# Patient Record
Sex: Female | Born: 1980 | Race: Asian | Hispanic: No | Marital: Married | Smoking: Never smoker
Health system: Southern US, Community
[De-identification: ages and names within clinical notes are randomized; demographics above are authoritative.]

## PROBLEM LIST (undated history)

## (undated) DIAGNOSIS — E119 Type 2 diabetes mellitus without complications: Secondary | ICD-10-CM

## (undated) DIAGNOSIS — O24419 Gestational diabetes mellitus in pregnancy, unspecified control: Secondary | ICD-10-CM

## (undated) HISTORY — DX: Type 2 diabetes mellitus without complications: E11.9

## (undated) HISTORY — PX: NO PAST SURGERIES: SHX2092

---

## 2017-04-02 LAB — OB RESULTS CONSOLE RUBELLA ANTIBODY, IGM: Rubella: IMMUNE

## 2017-04-02 LAB — OB RESULTS CONSOLE RPR: RPR: NONREACTIVE

## 2017-04-02 LAB — OB RESULTS CONSOLE GC/CHLAMYDIA
CHLAMYDIA, DNA PROBE: NEGATIVE
GC PROBE AMP, GENITAL: NEGATIVE

## 2017-04-02 LAB — OB RESULTS CONSOLE HGB/HCT, BLOOD
HCT: 36
HEMOGLOBIN: 11.6

## 2017-04-02 LAB — OB RESULTS CONSOLE ABO/RH: RH Type: POSITIVE

## 2017-04-02 LAB — OB RESULTS CONSOLE HEPATITIS B SURFACE ANTIGEN: HEP B S AG: NEGATIVE

## 2017-04-02 LAB — OB RESULTS CONSOLE VARICELLA ZOSTER ANTIBODY, IGG: Varicella: IMMUNE

## 2017-04-02 LAB — OB RESULTS CONSOLE ANTIBODY SCREEN: ANTIBODY SCREEN: NEGATIVE

## 2017-04-02 LAB — OB RESULTS CONSOLE HIV ANTIBODY (ROUTINE TESTING): HIV: NONREACTIVE

## 2017-05-03 ENCOUNTER — Encounter: Payer: Self-pay | Admitting: Family Medicine

## 2017-05-03 DIAGNOSIS — Z789 Other specified health status: Secondary | ICD-10-CM | POA: Insufficient documentation

## 2017-05-03 DIAGNOSIS — O09299 Supervision of pregnancy with other poor reproductive or obstetric history, unspecified trimester: Secondary | ICD-10-CM | POA: Insufficient documentation

## 2017-05-03 DIAGNOSIS — O0990 Supervision of high risk pregnancy, unspecified, unspecified trimester: Secondary | ICD-10-CM | POA: Insufficient documentation

## 2017-05-11 ENCOUNTER — Encounter: Payer: Self-pay | Admitting: Family Medicine

## 2017-05-11 ENCOUNTER — Ambulatory Visit (INDEPENDENT_AMBULATORY_CARE_PROVIDER_SITE_OTHER): Payer: Medicaid Other | Admitting: Family Medicine

## 2017-05-11 VITALS — BP 100/70 | HR 77 | Ht 59.0 in | Wt 112.0 lb

## 2017-05-11 DIAGNOSIS — O09899 Supervision of other high risk pregnancies, unspecified trimester: Secondary | ICD-10-CM

## 2017-05-11 DIAGNOSIS — O09522 Supervision of elderly multigravida, second trimester: Secondary | ICD-10-CM

## 2017-05-11 DIAGNOSIS — Z789 Other specified health status: Secondary | ICD-10-CM

## 2017-05-11 DIAGNOSIS — O09892 Supervision of other high risk pregnancies, second trimester: Secondary | ICD-10-CM

## 2017-05-11 DIAGNOSIS — O24419 Gestational diabetes mellitus in pregnancy, unspecified control: Secondary | ICD-10-CM

## 2017-05-11 DIAGNOSIS — O09529 Supervision of elderly multigravida, unspecified trimester: Secondary | ICD-10-CM | POA: Insufficient documentation

## 2017-05-11 MED ORDER — ACCU-CHEK FASTCLIX LANCETS MISC
12 refills | Status: DC
Start: 2017-05-11 — End: 2017-06-01

## 2017-05-11 MED ORDER — GLUCOSE BLOOD VI STRP: ORAL_STRIP | 12 refills | Status: DC

## 2017-05-11 MED ORDER — ACCU-CHEK GUIDE W/DEVICE KIT: 1.0000 | PACK | Freq: Four times a day (QID) | 0 refills | Status: DC

## 2017-05-11 NOTE — Progress Notes (Signed)
  Subjective:  Janice Hines is a [id number] 2459w0d being seen today for her first obstetrical visit. [facility] 8 weeks and had an early 1hr GTT that was elevated. A 3hr GTT was also elevated and the patient was referred here. Her obstetrical history is significant for Diabetes in pregnancy, failed 3-hour GTT at 14 weeks, AMA (will be 37 at time of delivery). Patient does intend to breast feed. Pregnancy history fully reviewed.  Patient reports no complaints.   BP 100/70   Pulse 77   Ht 4\' 11"  (1.499 m)   Wt 112 lb (50.8 kg)   LMP 02/02/2017   BMI 22.62 kg/m   HISTORY: OB History  Gravida Para Term Preterm AB Living  4 3 3     2   SAB TAB Ectopic Multiple Live Births          3    # Outcome Date GA Lbr Len/2nd Weight Sex Delivery Anes PTL Lv  4 Current           3 Term 06/20/13   9 lb 8 oz (4.309 kg)  Vag-Spont   LIV  2 Term 06/11/09   8 lb 11 oz (3.941 kg) M Vag-Spont     1 Term 03/31/04 10884w0d  8 lb 3 oz (3.714 kg) M    LIV      History reviewed. No pertinent past medical history.  History reviewed. No pertinent surgical history.  History reviewed. No pertinent family history.   Exam    Uterus:     Pelvic Exam: Previously done  System:     Skin: normal coloration and turgor, no rashes    Neurologic: gait normal; reflexes normal and symmetric   Extremities: normal strength, tone, and muscle mass, no deformities, no erythema, induration, or nodules   HEENT PERRLA and extra ocular movement intact   Mouth/Teeth mucous membranes moist, pharynx normal without lesions   Neck supple and no masses   Cardiovascular: regular rate and rhythm, no murmurs or gallops   Respiratory:  appears well, vitals normal, no respiratory distress, acyanotic, normal RR, ear and throat exam is normal, neck free of mass or lymphadenopathy, chest clear, no wheezing, crepitations, rhonchi, normal symmetric air entry   Abdomen: soft, non-tender; bowel sounds normal; no masses,  no organomegaly       Assessment:    Pregnancy: [id number] There are no active problems to display for this patient.     Plan:   1. Gestational diabetes mellitus (GDM), antepartum, gestational diabetes method of control unspecified Discussed implications of DM on baby. Will check HgA1c to see if preexisting or gestational. May need TSH if HgA1c >6.0. May need CMP and UP:C.  - Hemoglobin A1c - Referral to Nutrition and Diabetes Services - US MFM OB DETAIL +14 WK; Future  2. Supervision of other high risk pregnancy, antepartum Fht and FH normal - US MFM OB DETAIL +14 WK; Future  3. AMA (advanced maternal age) multigravida 35+, unspecified trimester Discussed genetic testing for Down syndrome - declined. - US MFM OB DETAIL +14 WK; Future  4. Language barrier Burmese - interpreter used.     Problem list reviewed and updated. 75% of 30 min visit spent on counseling and coordination of care.     Levie HeritageJacob J Lalita Ebel 05/11/2017

## 2017-05-11 NOTE — Progress Notes (Signed)
Pt is having some occasional nausea, doing better now. Pt is having some lower pelvic pressure.

## 2017-05-12 LAB — HEMOGLOBIN A1C
Est. average glucose Bld gHb Est-mCnc: 88 mg/dL
Hgb A1c MFr Bld: 4.7 % — ABNORMAL LOW (ref 4.8–5.6)

## 2017-05-22 ENCOUNTER — Encounter: Payer: Medicaid Other | Attending: Family Medicine | Admitting: Skilled Nursing Facility1

## 2017-05-22 ENCOUNTER — Encounter: Payer: Self-pay | Admitting: Skilled Nursing Facility1

## 2017-05-22 DIAGNOSIS — Z713 Dietary counseling and surveillance: Secondary | ICD-10-CM | POA: Diagnosis not present

## 2017-05-22 DIAGNOSIS — O24419 Gestational diabetes mellitus in pregnancy, unspecified control: Secondary | ICD-10-CM

## 2017-05-22 NOTE — Progress Notes (Signed)
Lay-she from language resources interpretor Diabetes Self-Management Education  Visit Type: First/Initial  05/22/2017  Ms. Janice Hines, identified by name and date of birth, is a 36 y.o. female with a diagnosis of Diabetes: Gestational Diabetes.   ASSESSMENT  Height 4\' 11"  (1.499 m), weight 110 lb 6.4 oz (50.1 kg), last menstrual period 02/02/2017. Body mass index is 22.3 kg/m.  Accompanied by niece.  3 months along. 4th pregnancy  Dizzy and throw up and less energy sometimes in the morning and sometimes in the evening  Weight has increased No prior diagnosis of diabetes/prediabetes   Pts blood sugar reading: 81  Diabetes Self-Management Education - 05/22/17 0944      Visit Information   Visit Type  First/Initial      Initial Visit   Diabetes Type  Gestational Diabetes      Psychosocial Assessment   Self-management support  Family    Other persons present  Interpreter;Family Member    Patient Concerns  Nutrition/Meal planning    Learning Readiness  Ready      Pre-Education Assessment   Patient understands the diabetes disease and treatment process.  Needs Instruction    Patient understands incorporating nutritional management into lifestyle.  Needs Instruction    Patient undertands incorporating physical activity into lifestyle.  Needs Instruction    Patient understands using medications safely.  Needs Instruction    Patient understands monitoring blood glucose, interpreting and using results  Needs Instruction    Patient understands prevention, detection, and treatment of acute complications.  Needs Instruction    Patient understands prevention, detection, and treatment of chronic complications.  Needs Instruction    Patient understands how to develop strategies to address psychosocial issues.  Needs Instruction    Patient understands how to develop strategies to promote health/change behavior.  Needs Instruction      Complications   How often do you check your blood  sugar?  0 times/day (not testing)    Have you had a dilated eye exam in the past 12 months?  No    Have you had a dental exam in the past 12 months?  No    Are you checking your feet?  No      Dietary Intake   Breakfast   1/2 cup rice and vegetables: broccoli or cabbage or asparagus  and fruit and meat: fish or pork or beef or chicken     Snack (morning)  fruit    Lunch  meat vegetable fruit: orange apple 1/2 rice with milk     Snack (afternoon)  fruit    Dinner  meat vegetable fruit: orange apple 1/2 rice milk     Snack (evening)  fruit    Beverage(s)   3 2% 8 oz of milk, water      Exercise   Exercise Type  Light (walking / raking leaves)    How many days per week to you exercise?  3    How many minutes per day do you exercise?  10    Total minutes per week of exercise  30      Patient Education   Previous Diabetes Education  No    Disease state   Factors that contribute to the development of diabetes    Nutrition management   Role of diet in the treatment of diabetes and the relationship between the three main macronutrients and blood glucose level;Reviewed blood glucose goals for pre and post meals and how to evaluate the patients' food intake on  their blood glucose level.    Physical activity and exercise   Role of exercise on diabetes management, blood pressure control and cardiac health.    Monitoring  Purpose and frequency of SMBG.;Taught/evaluated SMBG meter.;Identified appropriate SMBG and/or A1C goals.    Preconception care  Reviewed with patient blood glucose goals with pregnancy      Individualized Goals (developed by patient)   Nutrition  Follow meal plan discussed    Physical Activity  Exercise 3-5 times per week;15 minutes per day    Monitoring   test my blood glucose as discussed;test blood glucose pre and post meals as discussed      Post-Education Assessment   Patient understands the diabetes disease and treatment process.  Demonstrates understanding / competency     Patient understands incorporating nutritional management into lifestyle.  Demonstrates understanding / competency    Patient undertands incorporating physical activity into lifestyle.  Demonstrates understanding / competency    Patient understands using medications safely.  Demonstrates understanding / competency    Patient understands monitoring blood glucose, interpreting and using results  Demonstrates understanding / competency    Patient understands prevention, detection, and treatment of acute complications.  Demonstrates understanding / competency    Patient understands prevention, detection, and treatment of chronic complications.  Demonstrates understanding / competency    Patient understands how to develop strategies to address psychosocial issues.  Demonstrates understanding / competency    Patient understands how to develop strategies to promote health/change behavior.  Demonstrates understanding / competency      Outcomes   Expected Outcomes  Demonstrated interest in learning. Expect positive outcomes    Future DMSE  PRN    Program Status  Completed       Individualized Plan for Diabetes Self-Management Training:   Learning Objective:  Patient will have a greater understanding of diabetes self-management. Patient education plan is to attend individual and/or group sessions per assessed needs and concerns.   Plan:   There are no Patient Instructions on file for this visit.  Expected Outcomes:  Demonstrated interest in learning. Expect positive outcomes  Education material provided: Niece took notes in Burmese  If problems or questions, patient to contact team via:  Phone  Future DSME appointment: PRN.

## 2017-06-01 ENCOUNTER — Encounter: Payer: Self-pay | Admitting: Family Medicine

## 2017-06-01 ENCOUNTER — Ambulatory Visit (INDEPENDENT_AMBULATORY_CARE_PROVIDER_SITE_OTHER): Payer: Medicaid Other | Admitting: Family Medicine

## 2017-06-01 VITALS — BP 103/61 | HR 78 | Wt 111.0 lb

## 2017-06-01 DIAGNOSIS — O24419 Gestational diabetes mellitus in pregnancy, unspecified control: Secondary | ICD-10-CM

## 2017-06-01 DIAGNOSIS — O09522 Supervision of elderly multigravida, second trimester: Secondary | ICD-10-CM

## 2017-06-01 DIAGNOSIS — Z789 Other specified health status: Secondary | ICD-10-CM

## 2017-06-01 DIAGNOSIS — O09899 Supervision of other high risk pregnancies, unspecified trimester: Secondary | ICD-10-CM

## 2017-06-01 DIAGNOSIS — O09529 Supervision of elderly multigravida, unspecified trimester: Secondary | ICD-10-CM

## 2017-06-01 MED ORDER — ACCU-CHEK FASTCLIX LANCETS MISC: 12 refills | Status: DC

## 2017-06-01 MED ORDER — PRENATAL PLUS 27-1 MG PO TABS: 1.0000 | ORAL_TABLET | Freq: Every day | ORAL | 11 refills | Status: AC

## 2017-06-01 MED ORDER — GLUCOSE BLOOD VI STRP: ORAL_STRIP | 12 refills | Status: DC

## 2017-06-01 NOTE — Progress Notes (Signed)
Subjective:  Janice Hines is a 36 y.o. [id number] at 3164w0d being seen today for ongoing prenatal care.  She is currently monitored for the following issues for this high-risk pregnancy and has Gestational diabetes mellitus (GDM), antepartum; Supervision of other high risk pregnancy, antepartum; AMA (advanced maternal age) multigravida 35+, unspecified trimester; and Language barrier on their problem list.  GDM: Patient diet controlled. HgA1c checked last visit: 4.7 Fasting: controlled 2hr PP: had 3 elevated CBGs after dinner, but improved over last 5 days after limiting rice in diet  Patient reports no complaints.  Contractions: Not present. Vag. Bleeding: None.  Movement: Present. Denies leaking of fluid.   The following portions of the patient's history were reviewed and updated as appropriate: allergies, current medications, past family history, past medical history, past social history, past surgical history and problem list. Problem list updated.  Objective:   Vitals:   06/01/17 0921  BP: 103/61  Pulse: 78  Weight: 111 lb (50.3 kg)    Fetal Status: Fetal Heart Rate (bpm): 157   Movement: Present     General:  Alert, oriented and cooperative. Patient is in no acute distress.  Skin: Skin is warm and dry. No rash noted.   Cardiovascular: Normal heart rate noted  Respiratory: Normal respiratory effort, no problems with respiration noted  Abdomen: Soft, gravid, appropriate for gestational age. Pain/Pressure: Absent     Pelvic: Vag. Bleeding: None Vag D/C Character: Thin   Cervical exam deferred        Extremities: Normal range of motion.  Edema: None  Mental Status: Normal mood and affect. Normal behavior. Normal judgment and thought content.   Urinalysis:      Assessment and Plan:  Pregnancy: [id number] at 4564w0d  1. Supervision of other high risk pregnancy, antepartum FHT and FH normal. US on 12/17  2. Gestational diabetes mellitus (GDM), antepartum, gestational diabetes method of  control unspecified Continue diet control  3. AMA (advanced maternal age) multigravida 35+, unspecified trimester Declined genetic testing  4. Language barrier Interpreter present, sister interpreted most of visit   Preterm labor symptoms and general obstetric precautions including but not limited to vaginal bleeding, contractions, leaking of fluid and fetal movement were reviewed in detail with the patient. Please refer to After Visit Summary for other counseling recommendations.  Return in about 3 weeks (around 06/22/2017) for OB f/u.   Levie HeritageStinson, Jacob J, DO

## 2017-06-12 ENCOUNTER — Ambulatory Visit (HOSPITAL_COMMUNITY)
Admission: RE | Admit: 2017-06-12 | Discharge: 2017-06-12 | Disposition: A | Discharge status: Home / Self Care | Payer: Medicaid Other | Source: Ambulatory Visit | Attending: Family Medicine | Admitting: Family Medicine

## 2017-06-12 ENCOUNTER — Encounter (HOSPITAL_COMMUNITY): Payer: Self-pay

## 2017-06-12 ENCOUNTER — Other Ambulatory Visit: Payer: Self-pay | Admitting: Family Medicine

## 2017-06-12 ENCOUNTER — Other Ambulatory Visit: Payer: Self-pay

## 2017-06-12 DIAGNOSIS — Z3689 Encounter for other specified antenatal screening: Secondary | ICD-10-CM | POA: Diagnosis not present

## 2017-06-12 DIAGNOSIS — O09522 Supervision of elderly multigravida, second trimester: Secondary | ICD-10-CM | POA: Diagnosis not present

## 2017-06-12 DIAGNOSIS — O321XX0 Maternal care for breech presentation, not applicable or unspecified: Secondary | ICD-10-CM | POA: Diagnosis not present

## 2017-06-12 DIAGNOSIS — Z3A18 18 weeks gestation of pregnancy: Secondary | ICD-10-CM | POA: Diagnosis not present

## 2017-06-12 DIAGNOSIS — O09529 Supervision of elderly multigravida, unspecified trimester: Secondary | ICD-10-CM

## 2017-06-12 DIAGNOSIS — O24410 Gestational diabetes mellitus in pregnancy, diet controlled: Secondary | ICD-10-CM | POA: Diagnosis not present

## 2017-06-12 DIAGNOSIS — O09899 Supervision of other high risk pregnancies, unspecified trimester: Secondary | ICD-10-CM

## 2017-06-12 DIAGNOSIS — O24419 Gestational diabetes mellitus in pregnancy, unspecified control: Secondary | ICD-10-CM

## 2017-06-12 NOTE — Progress Notes (Signed)
Genetic Counseling  High-Risk Gestation Note  Appointment Date:  06/12/2017 Referred By: Levie HeritageStinson, Jacob J, DO Date of Birth:  Jan 11, 1981   Pregnancy History: [id number] Estimated Date of Delivery: 11/09/17 Estimated Gestational Age: 9137w4d Attending: Darlyn ReadEmily Bunce, MD   Janice Hines was seen for genetic counseling because of a maternal age of 36 years old at delivery. Language Resources Burmese/English interpreter, Misty StanleyLisa, provided interpretation for today's visit. The patient was also accompanied by two nieces.    In summary:  Discussed AMA and associated risk for fetal aneuploidy  Reviewed results of previous screening  First screen within normal limits  Discussed options for screening  NIPS- declined  Ultrasound- performed today; see separate report  Discussed diagnostic testing options  Amniocentesis- declined  Reviewed family history concerns  They were counseled regarding maternal age and the association with risk for chromosome conditions due to nondisjunction with aging of the ova.   We reviewed chromosomes, nondisjunction, and the associated 1 in 141 risk for fetal aneuploidy at 3737w4d gestation related to a maternal age of 36 years old at delivery.  She was counseled that the risk for aneuploidy decreases as gestational age increases, accounting for those pregnancies which spontaneously abort.  We specifically discussed Down syndrome (trisomy 21), trisomies 4513 and 3818.   Janice Hines previously had First trimester screening performed through her prior OB provider, which was within normal limits for the conditions screened. We reviewed these results and the associated reduction in risks for Down syndrome (1 in 276 to 1 in 5,000) and Trisomy 18 (1 in 539 to < 1 in 10,000). We reviewed that screening tests are used to modify a patient's a priori risk for aneuploidy, typically based on age to provide a pregnancy specific risk assessment. We reviewed the detection rates. She understands  this is not diagnostic and does not screen for all chromosome conditions.   We reviewed additional available screening options including noninvasive prenatal screening (NIPS)/cell free DNA (cfDNA) screening and detailed ultrasound.  She was counseled that screening tests are used to modify a patient's a priori risk for aneuploidy, typically based on age. This estimate provides a pregnancy specific risk assessment. We reviewed the benefits and limitations of each option. Specifically, we discussed the conditions for which each test screens, the detection rates, and false positive rates of each. She was also counseled regarding diagnostic testing via amniocentesis. We reviewed the approximate 1 in 300-500 risk for complications from amniocentesis, including spontaneous pregnancy loss. We discussed the possible results that the tests might provide including: positive, negative, unanticipated, and no result. Finally, they were counseled regarding the cost of each option and potential out of pocket expenses.   A complete ultrasound was performed today. The ultrasound report will be sent under separate cover. There were no visualized fetal anomalies or markers suggestive of aneuploidy. After consideration of all the options, she declined NIPS and amniocentesis, indicating that she was comfortable with risk assessment from ultrasound and First screen. She understands that screening tests cannot rule out all birth defects or genetic syndromes. The patient was advised of this limitation and states she still does not want additional testing at this time.   Both family histories were reviewed and found to be noncontributory for birth defects, intellectual disability, and known genetic conditions. Burmese ancestry was reported for the couple, and consanguinity was denied. Without further information regarding the provided family history, an accurate genetic risk cannot be calculated. Further genetic counseling is  warranted if more information is  obtained.  Janice Hines denied exposure to environmental toxins or chemical agents. She denied the use of alcohol, tobacco or street drugs. She denied significant viral illnesses during the course of her pregnancy.   I counseled Ms. Hurley Ciscoha Hern regarding the above risks and available options.  The approximate face-to-face time with the genetic counselor was 20 minutes.  Janice PlowmanKaren Thimothy Barretta, MS,  Certified Genetic Counselor 06/12/2017

## 2017-06-12 NOTE — Progress Notes (Signed)
Interpreter present with patient from [employer].

## 2017-06-13 ENCOUNTER — Telehealth: Payer: Self-pay

## 2017-06-13 NOTE — Telephone Encounter (Signed)
Used interpreter [phone] for phone call out to patient to discuss results.  Left message for patient to return call back to the office. Armandina StammerJennifer Keelon Zurn RN

## 2017-06-13 NOTE — Telephone Encounter (Signed)
-----   Message from Levie HeritageJacob J Stinson, DO sent at 06/12/2017  1:46 PM EST ----- Please call patient and inform patient of placenta previa and discuss Pelvic Rest with her. Thanks!

## 2017-06-22 ENCOUNTER — Ambulatory Visit (INDEPENDENT_AMBULATORY_CARE_PROVIDER_SITE_OTHER): Payer: Medicaid Other | Admitting: Obstetrics & Gynecology

## 2017-06-22 VITALS — BP 96/61 | HR 86 | Wt 117.0 lb

## 2017-06-22 DIAGNOSIS — O24419 Gestational diabetes mellitus in pregnancy, unspecified control: Secondary | ICD-10-CM

## 2017-06-22 DIAGNOSIS — Z789 Other specified health status: Secondary | ICD-10-CM

## 2017-06-22 DIAGNOSIS — Z23 Encounter for immunization: Secondary | ICD-10-CM

## 2017-06-22 DIAGNOSIS — O4402 Placenta previa specified as without hemorrhage, second trimester: Secondary | ICD-10-CM

## 2017-06-22 DIAGNOSIS — O09899 Supervision of other high risk pregnancies, unspecified trimester: Secondary | ICD-10-CM

## 2017-06-22 DIAGNOSIS — O09892 Supervision of other high risk pregnancies, second trimester: Secondary | ICD-10-CM

## 2017-06-22 DIAGNOSIS — O09529 Supervision of elderly multigravida, unspecified trimester: Secondary | ICD-10-CM

## 2017-06-22 NOTE — Telephone Encounter (Signed)
Patient discussed this with doctor at visit today. Armandina StammerJennifer Howard RNBS

## 2017-06-22 NOTE — Progress Notes (Signed)
   PRENATAL VISIT NOTE  Subjective:  Janice Hines is a 36 y.o. G4P3003 at 6639w0d being seen today for ongoing prenatal care.  She is currently monitored for the following issues for this high-risk pregnancy and has Gestational diabetes mellitus (GDM), antepartum; Supervision of other high risk pregnancy, antepartum; AMA (advanced maternal age) multigravida 35+, unspecified trimester; Language barrier; and [redacted] weeks gestation of pregnancy on their problem list.  Patient reports no complaints.  Contractions: Irritability. Vag. Bleeding: None.  Movement: Present. Denies leaking of fluid.   The following portions of the patient's history were reviewed and updated as appropriate: allergies, current medications, past family history, past medical history, past social history, past surgical history and problem list. Problem list updated.  Objective:   Vitals:   06/22/17 0923  BP: 96/61  Pulse: 86  Weight: 117 lb (53.1 kg)    Fetal Status:     Movement: Present     General:  Alert, oriented and cooperative. Patient is in no acute distress.  Skin: Skin is warm and dry. No rash noted.   Cardiovascular: Normal heart rate noted  Respiratory: Normal respiratory effort, no problems with respiration noted  Abdomen: Soft, gravid, appropriate for gestational age.  Pain/Pressure: Absent     Pelvic: Cervical exam deferred        Extremities: Normal range of motion.     Mental Status:  Normal mood and affect. Normal behavior. Normal judgment and thought content.   Assessment and Plan:  Pregnancy: G4P3003 at 5739w0d  1. Supervision of other high risk pregnancy, antepartum  - Flu Vaccine QUAD 36+ mos IM (Fluarix, Quad PF)  2. Gestational diabetes mellitus (GDM), antepartum, gestational diabetes method of control unspecified Her sugars are ALL normal. I told her that she can quit checking sugars 2 hours after supper.  3. Language barrier Live interpretor present for exam  4. AMA (advanced maternal age)  multigravida 35+, unspecified trimester Normal first screen  5. Placenta previa in second trimester - AFP today - US MFM OB FOLLOW UP; Future - to follow up on previa at 28 weeks  Preterm labor symptoms and general obstetric precautions including but not limited to vaginal bleeding, contractions, leaking of fluid and fetal movement were reviewed in detail with the patient. Please refer to After Visit Summary for other counseling recommendations.  No Follow-up on file.   Allie BossierMyra C Adelheid Hoggard, MD

## 2017-06-26 LAB — AFP, SERUM, OPEN SPINA BIFIDA
AFP MoM: 1.05
AFP Value: 100.4 ng/mL
GEST. AGE ON COLLECTION DATE: 23.9 wk
Maternal Age At EDD: 35 yr
OSBR RISK 1 IN: 10000
TEST RESULTS AFP: NEGATIVE
Weight: 117 [lb_av]

## 2017-06-26 NOTE — L&D Delivery Note (Addendum)
Delivery Note At 3:38 AM a viable female was delivered via Vaginal, Spontaneous (Presentation: vertex; LOA ).  APGAR: , ; weight  .   Placenta status: intact , central cord insertion.  Cord: 3 vessel  with the following complications: none, extra long cord. Cord pH: N/A  Anesthesia:  Epidural Episiotomy: None Lacerations: None Suture Repair: N/A Est. Blood Loss (mL): 200  Mom to postpartum.  Baby to Couplet care / Skin to Skin.  Felisa Bonier, MD, PGY-1 Family Medicine - [facility] 11/06/2017, 3:58 AM  I confirm that I have verified the information documented in the resident's note and that I have also personally reperformed the physical exam and all medical decision making activities.  I was gloved and present for entire delivery SVD without incident No difficulty with shoulders No lacerations Cord noted to be very long, approximately 24-30 inches  Aviva Signs, CNM  Please schedule this patient for Postpartum visit in: 4 weeks with the following provider: Any provider For C/S patients schedule nurse incision check in weeks 2 weeks: no High risk pregnancy complicated by: GDM Delivery mode:  SVD Anticipated Birth Control:  BTL done PP PP Procedures needed: none  Schedule Integrated BH visit: no  Pt is Burmese

## 2017-07-23 ENCOUNTER — Encounter: Payer: Self-pay | Admitting: Obstetrics & Gynecology

## 2017-07-23 ENCOUNTER — Ambulatory Visit (INDEPENDENT_AMBULATORY_CARE_PROVIDER_SITE_OTHER): Payer: Medicaid Other | Admitting: Obstetrics & Gynecology

## 2017-07-23 VITALS — BP 104/70 | HR 97 | Wt 120.0 lb

## 2017-07-23 DIAGNOSIS — O09899 Supervision of other high risk pregnancies, unspecified trimester: Secondary | ICD-10-CM

## 2017-07-23 DIAGNOSIS — O09529 Supervision of elderly multigravida, unspecified trimester: Secondary | ICD-10-CM

## 2017-07-23 DIAGNOSIS — Z603 Acculturation difficulty: Secondary | ICD-10-CM

## 2017-07-23 DIAGNOSIS — O4402 Placenta previa specified as without hemorrhage, second trimester: Secondary | ICD-10-CM

## 2017-07-23 DIAGNOSIS — Z789 Other specified health status: Secondary | ICD-10-CM

## 2017-07-23 DIAGNOSIS — O09892 Supervision of other high risk pregnancies, second trimester: Secondary | ICD-10-CM

## 2017-07-23 DIAGNOSIS — O4400 Placenta previa specified as without hemorrhage, unspecified trimester: Secondary | ICD-10-CM | POA: Insufficient documentation

## 2017-07-23 DIAGNOSIS — O24410 Gestational diabetes mellitus in pregnancy, diet controlled: Secondary | ICD-10-CM

## 2017-07-23 DIAGNOSIS — O09522 Supervision of elderly multigravida, second trimester: Secondary | ICD-10-CM

## 2017-07-23 NOTE — Progress Notes (Signed)
   PRENATAL VISIT NOTE  Subjective:  Janice Hines is a 37 y.o. G4P3003 at 4459w3d being seen today for ongoing prenatal care.  She is currently monitored for the following issues for this high-risk pregnancy and has Gestational diabetes mellitus (GDM), antepartum; Supervision of other high risk pregnancy, antepartum; AMA (advanced maternal age) multigravida 35+, unspecified trimester; and Language barrier on their problem list.  Patient reports occ lower pelvic pain.  Contractions: Not present. Vag. Bleeding: None.  Movement: Present. Denies leaking of fluid.   The following portions of the patient's history were reviewed and updated as appropriate: allergies, current medications, past family history, past medical history, past social history, past surgical history and problem list. Problem list updated.  Objective:   Vitals:   07/23/17 1000  BP: 104/70  Pulse: 97  Weight: 120 lb (54.4 kg)    Fetal Status:     Movement: Present     General:  Alert, oriented and cooperative. Patient is in no acute distress.  Skin: Skin is warm and dry. No rash noted.   Cardiovascular: Normal heart rate noted  Respiratory: Normal respiratory effort, no problems with respiration noted  Abdomen: Soft, gravid, appropriate for gestational age.  Pain/Pressure: Present     Pelvic: Cervical exam deferred        Extremities: Normal range of motion.     Mental Status:  Normal mood and affect. Normal behavior. Normal judgment and thought content.   Assessment and Plan:  Pregnancy: G4P3003 at 5859w3d  1. Supervision of other high risk pregnancy, antepartum  2. Language barrier Pt has love interpreter with her this visit  3. Diet controlled gestational diabetes mellitus (GDM), antepartum All glucose values were WNL  4. AMA (advanced maternal age) multigravida 35+, unspecified trimester  5. Posterior placenta previa  No bleeding noted  As repeat US ordered  Preterm labor symptoms and general obstetric  precautions including but not limited to vaginal bleeding, contractions, leaking of fluid and fetal movement were reviewed in detail with the patient. Please refer to After Visit Summary for other counseling recommendations.  Return in about 4 weeks (around 08/20/2017).   Willodean Rosenthalarolyn Harraway-Smith, MD

## 2017-08-06 ENCOUNTER — Telehealth: Payer: Self-pay

## 2017-08-06 MED ORDER — GLUCOSE BLOOD VI STRP: ORAL_STRIP | 12 refills | Status: DC

## 2017-08-06 MED ORDER — ACCU-CHEK FASTCLIX LANCETS MISC: 12 refills | Status: DC

## 2017-08-06 NOTE — Telephone Encounter (Signed)
Patient needs refill on lancets sent to pharmacy. Verified pharmacy.  Armandina StammerJennifer Catherine Cubero RNBSN

## 2017-08-20 ENCOUNTER — Ambulatory Visit (INDEPENDENT_AMBULATORY_CARE_PROVIDER_SITE_OTHER): Payer: Medicaid Other | Admitting: Family Medicine

## 2017-08-20 ENCOUNTER — Encounter: Payer: Self-pay | Admitting: Family Medicine

## 2017-08-20 VITALS — BP 107/73 | HR 97 | Wt 124.0 lb

## 2017-08-20 DIAGNOSIS — O24410 Gestational diabetes mellitus in pregnancy, diet controlled: Secondary | ICD-10-CM

## 2017-08-20 DIAGNOSIS — O09529 Supervision of elderly multigravida, unspecified trimester: Secondary | ICD-10-CM

## 2017-08-20 DIAGNOSIS — O4402 Placenta previa specified as without hemorrhage, second trimester: Secondary | ICD-10-CM

## 2017-08-20 DIAGNOSIS — Z789 Other specified health status: Secondary | ICD-10-CM

## 2017-08-20 DIAGNOSIS — O09899 Supervision of other high risk pregnancies, unspecified trimester: Secondary | ICD-10-CM

## 2017-08-20 NOTE — Progress Notes (Signed)
   PRENATAL VISIT NOTE  Subjective:  Janice Hines is a 37 y.o. G4P3003 at 1327w3d being seen today for ongoing prenatal care.  She is currently monitored for the following issues for this high-risk pregnancy and has Gestational diabetes mellitus (GDM), antepartum; Supervision of other high risk pregnancy, antepartum; AMA (advanced maternal age) multigravida 35+, unspecified trimester; Language barrier; and Placenta previa on their problem list.  Patient reports no complaints.  Contractions: Not present. Vag. Bleeding: None.  Movement: Present. Denies leaking of fluid.   The following portions of the patient's history were reviewed and updated as appropriate: allergies, current medications, past family history, past medical history, past social history, past surgical history and problem list. Problem list updated.  Objective:   Vitals:   08/20/17 1017  BP: 107/73  Pulse: 97  Weight: 124 lb (56.2 kg)    Fetal Status: Fetal Heart Rate (bpm): 160 Fundal Height: 29 cm Movement: Present     General:  Alert, oriented and cooperative. Patient is in no acute distress.  Skin: Skin is warm and dry. No rash noted.   Cardiovascular: Normal heart rate noted  Respiratory: Normal respiratory effort, no problems with respiration noted  Abdomen: Soft, gravid, appropriate for gestational age.  Pain/Pressure: Absent     Pelvic: Cervical exam deferred        Extremities: Normal range of motion.  Edema: None  Mental Status:  Normal mood and affect. Normal behavior. Normal judgment and thought content.   Assessment and Plan:  Pregnancy: G4P3003 at 3827w3d  1. Supervision of other high risk pregnancy, antepartum FHT and FH normal  2. Language barrier  3. Diet controlled gestational diabetes mellitus (GDM), antepartum Blood sugar log reviewed - all controlled  4. Placenta previa in second trimester F/U US   5. AMA (advanced maternal age) multigravida 35+, unspecified trimester   Preterm labor symptoms  and general obstetric precautions including but not limited to vaginal bleeding, contractions, leaking of fluid and fetal movement were reviewed in detail with the patient. Please refer to After Visit Summary for other counseling recommendations.  Return in about 2 weeks (around 09/03/2017) for OB f/u.   Levie HeritageJacob J Husayn Reim, DO

## 2017-08-21 ENCOUNTER — Other Ambulatory Visit: Payer: Self-pay | Admitting: Obstetrics & Gynecology

## 2017-08-21 ENCOUNTER — Ambulatory Visit (HOSPITAL_COMMUNITY)
Admission: RE | Admit: 2017-08-21 | Discharge: 2017-08-21 | Disposition: A | Discharge status: Home / Self Care | Payer: Medicaid Other | Source: Ambulatory Visit | Attending: Obstetrics & Gynecology | Admitting: Obstetrics & Gynecology

## 2017-08-21 ENCOUNTER — Encounter (HOSPITAL_COMMUNITY): Payer: Self-pay

## 2017-08-21 DIAGNOSIS — O4402 Placenta previa specified as without hemorrhage, second trimester: Secondary | ICD-10-CM

## 2017-08-21 DIAGNOSIS — O09523 Supervision of elderly multigravida, third trimester: Secondary | ICD-10-CM | POA: Diagnosis not present

## 2017-08-21 DIAGNOSIS — Z362 Encounter for other antenatal screening follow-up: Secondary | ICD-10-CM | POA: Diagnosis present

## 2017-08-21 DIAGNOSIS — Z3A28 28 weeks gestation of pregnancy: Secondary | ICD-10-CM | POA: Diagnosis not present

## 2017-08-21 DIAGNOSIS — O24410 Gestational diabetes mellitus in pregnancy, diet controlled: Secondary | ICD-10-CM | POA: Diagnosis not present

## 2017-08-21 DIAGNOSIS — O4403 Placenta previa specified as without hemorrhage, third trimester: Secondary | ICD-10-CM | POA: Insufficient documentation

## 2017-08-21 DIAGNOSIS — O322XX0 Maternal care for transverse and oblique lie, not applicable or unspecified: Secondary | ICD-10-CM | POA: Diagnosis not present

## 2017-09-04 ENCOUNTER — Ambulatory Visit (INDEPENDENT_AMBULATORY_CARE_PROVIDER_SITE_OTHER): Payer: Medicaid Other | Admitting: Advanced Practice Midwife

## 2017-09-04 ENCOUNTER — Encounter: Payer: Self-pay | Admitting: Advanced Practice Midwife

## 2017-09-04 VITALS — BP 114/75 | HR 80 | Wt 124.0 lb

## 2017-09-04 DIAGNOSIS — O09529 Supervision of elderly multigravida, unspecified trimester: Secondary | ICD-10-CM

## 2017-09-04 DIAGNOSIS — Z789 Other specified health status: Secondary | ICD-10-CM

## 2017-09-04 DIAGNOSIS — O24410 Gestational diabetes mellitus in pregnancy, diet controlled: Secondary | ICD-10-CM

## 2017-09-04 DIAGNOSIS — O09899 Supervision of other high risk pregnancies, unspecified trimester: Secondary | ICD-10-CM

## 2017-09-04 DIAGNOSIS — O4402 Placenta previa specified as without hemorrhage, second trimester: Secondary | ICD-10-CM

## 2017-09-04 NOTE — Progress Notes (Signed)
   PRENATAL VISIT NOTE  Subjective:  Janice Hines is a 37 y.o. G4P3003 at 3870w4d being seen today for ongoing prenatal care.  She is currently monitored for the following issues for this high-risk pregnancy and has Gestational diabetes mellitus (GDM), antepartum; Supervision of other high risk pregnancy, antepartum; AMA (advanced maternal age) multigravida 35+, unspecified trimester; Language barrier; and H/O macrosomia in infant in prior pregnancy, currently pregnant on their problem list.   Interpretor present (niece)   Patient reports round ligament pain, doing well with diet, needs work note for husband/FMLA.  Contractions: Not present. Vag. Bleeding: None.  Movement: Present. Denies leaking of fluid.   The following portions of the patient's history were reviewed and updated as appropriate: allergies, current medications, past family history, past medical history, past social history, past surgical history and problem list. Problem list updated.  Objective:   Vitals:   09/04/17 1038  BP: 114/75  Pulse: 80  Weight: 124 lb (56.2 kg)    Fetal Status: Fetal Heart Rate (bpm): 145   Movement: Present     General:  Alert, oriented and cooperative. Patient is in no acute distress.  Skin: Skin is warm and dry. No rash noted.   Cardiovascular: Normal heart rate noted  Respiratory: Normal respiratory effort, no problems with respiration noted  Abdomen: Soft, gravid, appropriate for gestational age.  Pain/Pressure: Present     Pelvic: Cervical exam deferred        Extremities: Normal range of motion.  Edema: None  Mental Status:  Normal mood and affect. Normal behavior. Normal judgment and thought content.   Assessment and Plan:  Pregnancy: G4P3003 at 4170w4d  1. Placenta previa in second trimester      Posterior now per 2/26 ultrasound, Pt informed  2. Supervision of other high risk pregnancy, antepartum       Feels well  3. Language barrier      Niece present today  4. Diet controlled  gestational diabetes mellitus (GDM), antepartum     FBS  [id number]     P lunch   [id number]     P dinner   [id number]  Other values from prior week are similar.  Only one value in 2 wks was 135  5. AMA (advanced maternal age) multigravida 35+, unspecified trimester        Preterm labor symptoms and general obstetric precautions including but not limited to vaginal bleeding, contractions, leaking of fluid and fetal movement were reviewed in detail with the patient. Please refer to After Visit Summary for other counseling recommendations.  Return in about 2 weeks (around 09/18/2017) [facility].   Wynelle BourgeoisMarie Edouard Gikas, CNM

## 2017-09-04 NOTE — Patient Instructions (Signed)

## 2017-09-18 ENCOUNTER — Encounter: Payer: Self-pay | Admitting: Advanced Practice Midwife

## 2017-09-18 ENCOUNTER — Ambulatory Visit (INDEPENDENT_AMBULATORY_CARE_PROVIDER_SITE_OTHER): Payer: Medicaid Other | Admitting: Advanced Practice Midwife

## 2017-09-18 VITALS — BP 122/68 | HR 96 | Wt 128.0 lb

## 2017-09-18 DIAGNOSIS — O09899 Supervision of other high risk pregnancies, unspecified trimester: Secondary | ICD-10-CM

## 2017-09-18 DIAGNOSIS — O09893 Supervision of other high risk pregnancies, third trimester: Secondary | ICD-10-CM

## 2017-09-18 NOTE — Progress Notes (Signed)
   PRENATAL VISIT NOTE  Subjective:  Roxan Hockeyha Doebler is a 37 y.o. G4P3003 at 5121w4d being seen today for ongoing prenatal care.  She is currently monitored for the following issues for this high-risk pregnancy and has Gestational diabetes mellitus (GDM), antepartum; Supervision of other high risk pregnancy, antepartum; AMA (advanced maternal age) multigravida 35+, unspecified trimester; Language barrier; and H/O macrosomia in infant in prior pregnancy, currently pregnant on their problem list.  Patient reports occasional contractions.  Contractions: Not present. Vag. Bleeding: None.  Movement: Present. Denies leaking of fluid.   The following portions of the patient's history were reviewed and updated as appropriate: allergies, current medications, past family history, past medical history, past social history, past surgical history and problem list. Problem list updated.  Objective:   Vitals:   09/18/17 0952  BP: 122/68  Pulse: 96  Weight: 128 lb (58.1 kg)    Fetal Status: Fetal Heart Rate (bpm): 158   Movement: Present     General:  Alert, oriented and cooperative. Patient is in no acute distress.  Skin: Skin is warm and dry. No rash noted.   Cardiovascular: Normal heart rate noted  Respiratory: Normal respiratory effort, no problems with respiration noted  Abdomen: Soft, gravid, appropriate for gestational age.  Pain/Pressure: Present     Pelvic: Cervical exam deferred        Extremities: Normal range of motion.  Edema: None  Mental Status:  Normal mood and affect. Normal behavior. Normal judgment and thought content.   Assessment and Plan:  Pregnancy: G4P3003 at 6321w4d  1. Supervision of other high risk pregnancy, antepartum     Reviewed warning signs to come to [facility] for  2. Language barrier Pt has love interpreter with her this visit, niece  3. Diet controlled gestational diabetes mellitus (GDM), antepartum All glucose values were WNL except two values post lunch of 131 and 136.   Discussed watching rice intake.   4. AMA (advanced maternal age) multigravida 35+, unspecified trimester     Preterm labor symptoms and general obstetric precautions including but not limited to vaginal bleeding, contractions, leaking of fluid and fetal movement were reviewed in detail with the patient. Please refer to After Visit Summary for other counseling recommendations.  RTO 2 weeks   Wynelle BourgeoisMarie Williams, CNM

## 2017-09-18 NOTE — Patient Instructions (Signed)
Terceiro trimestre da gestao (Third Trimester of Pregnancy) O terceiro trimestre vai da semana 29 at a semana 42, do 7 ao 9 ms. O terceiro trimestre  uma poca em que o feto se desenvolve rapidamente. No final do nono ms, o feto tem por volta de 20 polegadas de comprimento e pesa entre 6-10 libras. ALTERAES CORPORAIS Seu corpo passa por muitas mudanas durante a gestao. As mudanas variam de mulher para mulher.  Seu peso continuar a aumentar. Voc pode esperar ganhar de 25-35 libras (11-16 kg) at o final da gestao.  Voc pode comear a ter estrias nos quadris, abdome e seios.  Voc pode urinar com mais frequncia, pois o feto est se movendo para baixo em sua plvis e pressionando sua bexiga.  Voc pode desenvolver ou continuar a ter azia como resultado de sua gravidez.  Voc pode ficar com o intestino preso, pois certos hormnios esto fazendo com que os msculos que empurram as fezes em seu intestino fiquem mais lentos.  Voc pode desenvolver hemorroidas ou veias inchadas e salientes (veias varicosas).  Voc pode ter dor plvica devido ao ganho de peso e aos hormnios da gravidez que relaxam suas articulaes entre os ossos de sua plvis. Dores nas costas podem ser causadas pela sobrecarga dos msculos que do suporte  sua postura.  Voc pode ter alteraes em seus cabelos. Essas alteraes incluem afinamento, crescimento rpido e alteraes na textura de seu cabelo. Algumas mulheres tambm sofrem queda de cabelos durante ou aps a gravidez, ou cabelos com aspecto seco e fino. Seus cabelos provavelmente voltaro ao normal depois do nascimento do beb.  Seus seios continuaro a crescer e a ficar sensveis. Um fluido amarelo, chamado de colostro, pode vazar de seus seios.  Seu umbigo pode saltar para fora.  Voc pode sentir falta de ar devido a expanso de seu tero.  Voc pode sentir o feto "caindo", ou se movendo para baixo em seu abdome.  Voc pode ter um corrimento  mucoso com sangue. Isso geralmente ocorre de alguns dias a uma semana antes do parto.  Seu colo do tero fica fino e macio (afinado) com a aproximao da data do parto. O QUE ESPERAR DE SEUS EXAMES PR-NATAL Voc realizar exames pr-natal a cada 2 semanas at a 36 semana. Depois, voc realizar exames pr-natal semanais. Durante uma consulta pr-natal de rotina:  Voc ser pesada para garantir que voc e o feto estejam crescendo normalmente.  Sua presso arterial ser medida.  Seu abdome ser medido para acompanhar o crescimento de seu beb.  Os batimentos cardacos do feto sero auscultados.  Todos os resultados dos exames da consulta anterior sero discutidos.  Voc poder realizar um exame cervical quando a data do parto se aproximar para verificar sua dilatao. Por volta de 36 semanas, o mdico verificar seu colo do tero. Ao mesmo tempo, seu mdico tambm ir realizar um exame das secrees do tecido vaginal. Esse exame serve para determinar se um tipo de bactria, o estreptococo do grupo B, est presente. Seu mdico ir explicar isso mais adiante. Seu mdico pode lhe perguntar:  Qual  seu plano para o parto.  Como voc est se sentindo.  Se voc est sentindo o beb se mexer.  Se voc teve algum sintoma anormal, como vazamento de lquido, sangramento, dores de cabea intensas ou clicas abdominais.  Se voc est usando produtos de tabaco, incluindo cigarros, tabaco de mascar ou cigarros eletrnicos.  Se voc tem alguma dvida. Outros exames ou avaliaes que podero ser realizados durante seu   terceiro trimestre incluem:  Exames de sangue que verificam se h nveis baixos de ferro (anemia).  Exame final para verificar a sade, nvel de atividade e crescimento do feto. O teste  realizado se voc tiver certas condies mdicas ou se houver problemas durante a gestao.  Exame de HIV (sndrome da imunodeficincia adquirida). Se voc tiver risco alto, poder ser testada  para HIV durante o terceiro trimestre da gestao. FALSO TRABALHO DE PARTO Voc pode sentir contraes pequenas e irregulares que depois passam. Elas so chamadas de contraes Braxton Hicks, ou falso trabalho de parto. As contraes podem durar por horas, dias ou mesmo semanas antes do trabalho de parto real acontecer. Se as contraes vierem em intervalos regulares, se intensificarem, ou se tornarem dolorosas,  melhor que sejam observadas pelo seu mdico. SINAIS DO TRABALHO DE PARTO  Clicas do tipo menstrual.  Contraes com intervalos de 5 minutos ou menos.  Contraes que comeam na parte superior do tero e se espalha para a parte inferior do abdome e costas.  Uma sensao de presso plvica aumentada ou dor nas costas.  Um corrimento mucoso aquoso ou com sangue que vem da vagina. Se voc tiver qualquer um desses sintomas antes da 37 semana de gestao, ligue para seu mdico imediatamente. Voc precisa ir para o hospital para ser examinada imediatamente. INSTRUES PARA TRATAMENTO DOMICILIAR  Evite fumar, usar plantas medicinais, lcool e medicamentos no prescritos. Esses produtos qumicos afetam a formao e crescimento do beb.  No use nenhum produto de tabaco, incluindo cigarros, tabaco de mascar e cigarros eletrnicos. Caso precise de ajuda para parar de fumar, fale com seu mdico. Voc pode receber aconselhamento e outros recursos para lhe ajudar a parar.  Siga as instrues de seu mdico com relao ao uso de medicamentos. H medicamentos que so seguros ou perigosos de serem tomados durante a gestao.  Faa exerccios apenas conforme orientado pelo seu mdico. Sentir clicas uterinas  um sinal importante para parar de se exercitar.  Continue a fazer refeies regulares e saudveis.  Use um bom suti de sustentao para a sensibilidade nos seios.  No use banheiras de gua quente, saunas secas ou midas.  Sempre use o cinto de segurana ao dirigir.  Evite comer  carnes cruas, queijo no cozido, mantenha distncia de caixas de areia de gatos e de terrenos onde houver gatos. Eles carregam germes que podem causar malformaes ao beb.  Tome suas vitaminas pr-natal.  Tome de 1.500-2.000 mg de clcio todos os dias, comeando na 20 semana de gestao at o nascimento do beb.  Tente tomar um laxante emoliente (se seu mdico aprovar) caso fique com intestino preso. Coma mais alimentos ricos em fibra, como vegetais frescos ou frutas e gros integrais. Beba bastante lquido para manter sua urina clara ou na cor amarela plida.  Tome banhos mornos de assento para aliviar qualquer dor ou desconforto causado pelas hemorroidas. Use um creme para hemorroidas caso seu mdico aprove.  Se voc desenvolver veias varicosas, use meias elsticas. Eleve seus ps por 15 minutos, de 3-4 vezes por dia. Modere a quantidade de sal em sua dieta.  Evite levantar peso, use sapatos sem salto e mantenha uma boa postura.  Repouse bastante com suas pernas elevadas se tiver cibras nas pernas ou dor lombar.  Visite seu dentista se ainda no o tiver feito durante sua gestao. Use uma escova de dentes macia para escovar os dentes e use o fio dental com cuidado.  As relaes sexuais podem continuar a menos que seu mdico a oriente   de maneira diferente.  No viaje longas distncias a menos que seja absolutamente necessrio e apenas com a aprovao de seu mdico.  Assista aulas de pr-natal para compreender, praticar e fazer perguntas sobre o trabalho de parto e o parto.  Faa uma viagem experimental para o hospital.  Faa sua mala para o hospital.  Prepare o quarto do beb.  Continue a comparecer a todas as suas consultas pr-natal conforme orientado por seu mdico.  PROCURE UM MDICO SE:  No tiver certeza se est em trabalho de parto ou se sua bolsa romper.  Tiver vertigem.  Tiver cibras plvicas suaves, presso plvica ou dor persistente em sua regio  abdominal.  Tiver nusea, vmitos ou diarreia persistentes.  Tiver corrimento vaginal com cheiro ruim.  Sentir dor ao urinar.  PROCURE UM MDICO IMEDIATAMENTE SE:  Tiver febre.  Notar fluido vazando de sua vagina.  Perceber manchas ou sangramento vaginal.  Tiver dor ou cibra intensa no abdome.  Tiver perda ou ganho sbito de peso.  Tiver falta de ar com dor no peito.  Perceber inchao repentino ou extremo em seu rosto, mos, tornozelos, ps ou pernas.  No sentir seu beb se mexer por mais de uma hora.  Tiver dores de cabea intensas que no passam com medicao.  Sua viso ficar alterada.  Estas informaes no se destinam a substituir as recomendaes de seu mdico. No deixe de discutir quaisquer dvidas com seu mdico. Document Released: 07/09/2015 Document Revised: 07/09/2015 Document Reviewed: 08/13/2012 Elsevier Interactive Patient Education  2017 Elsevier Inc.  

## 2017-09-18 NOTE — Progress Notes (Signed)
   PRENATAL VISIT NOTE  Note created in error Duplicate

## 2017-10-02 ENCOUNTER — Encounter: Payer: Self-pay | Admitting: Advanced Practice Midwife

## 2017-10-02 ENCOUNTER — Ambulatory Visit (INDEPENDENT_AMBULATORY_CARE_PROVIDER_SITE_OTHER): Payer: Medicaid Other | Admitting: Advanced Practice Midwife

## 2017-10-02 VITALS — BP 121/80 | HR 99 | Wt 133.0 lb

## 2017-10-02 DIAGNOSIS — O24410 Gestational diabetes mellitus in pregnancy, diet controlled: Secondary | ICD-10-CM

## 2017-10-02 DIAGNOSIS — R21 Rash and other nonspecific skin eruption: Secondary | ICD-10-CM

## 2017-10-02 DIAGNOSIS — Z789 Other specified health status: Secondary | ICD-10-CM

## 2017-10-02 DIAGNOSIS — O09299 Supervision of pregnancy with other poor reproductive or obstetric history, unspecified trimester: Secondary | ICD-10-CM

## 2017-10-02 MED ORDER — ASPIRIN EC 81 MG PO TBEC: 81.0000 mg | DELAYED_RELEASE_TABLET | Freq: Every day | ORAL | 3 refills | Status: DC

## 2017-10-02 MED ORDER — TRIAMCINOLONE ACETONIDE 0.025 % EX CREA: 1.0000 "application " | TOPICAL_CREAM | Freq: Two times a day (BID) | CUTANEOUS | 0 refills | Status: DC

## 2017-10-02 NOTE — Progress Notes (Signed)
   PRENATAL VISIT NOTE  Subjective:  Janice Hines is a 37 y.o. G4P3003 at 6242w4d being seen today for ongoing prenatal care.  She is currently monitored for the following issues for this high-risk pregnancy and has Gestational diabetes mellitus (GDM), antepartum; Pregnancy; AMA (advanced maternal age) multigravida 35+, unspecified trimester; Language barrier; H/O macrosomia in infant in prior pregnancy, currently pregnant; and Rash on their problem list.  Patient reports Rash on both shoulders and upper back, itchy.  Contractions: Irritability. Vag. Bleeding: None.  Movement: Present. Denies leaking of fluid.   The following portions of the patient's history were reviewed and updated as appropriate: allergies, current medications, past family history, past medical history, past social history, past surgical history and problem list. Problem list updated.  Objective:   Vitals:   10/02/17 1037  BP: 121/80  Pulse: 99  Weight: 133 lb (60.3 kg)    Fetal Status: Fetal Heart Rate (bpm): 150   Movement: Present     General:  Alert, oriented and cooperative. Patient is in no acute distress.  Skin: Skin is warm and dry. No rash noted.   Cardiovascular: Normal heart rate noted  Respiratory: Normal respiratory effort, no problems with respiration noted  Abdomen: Soft, gravid, appropriate for gestational age.  Pain/Pressure: Present     Pelvic: Cervical exam deferred        Extremities: Normal range of motion.  Edema: None  Mental Status: Normal mood and affect. Normal behavior. Normal judgment and thought content.   Assessment and Plan:  Pregnancy: G4P3003 at 4842w4d  1. Diet controlled gestational diabetes mellitus (GDM) in third trimester     FBSs all within range, post meal values all normal under 120 except for two dinner values of 123 and 125 - US MFM OB FOLLOW UP; Future  >  Scheduled growth US 37-38 wks due to DM and hx macrosomia  2. Rash     ?pityriasis vs dermatitis, fine papular,     Rx  Triamcinolone cream bid  3. H/O macrosomia in infant in prior pregnancy, currently pregnant     US for growth  4. Language barrier     Niece helps interpret, understands some English  5. Diet controlled gestational diabetes mellitus (GDM), antepartum    See above, good control    Per Dr Earlene Plateravis, baby ASA not necessary  Preterm labor symptoms and general obstetric precautions including but not limited to vaginal bleeding, contractions, leaking of fluid and fetal movement were reviewed in detail with the patient. Please refer to After Visit Summary for other counseling recommendations.  RTO 2 wks  Future Appointments  Date Time Provider Department Center  10/16/2017 10:45 AM Leftwich-Kirby, Wilmer FloorLisa A, CNM [facility] None    Wynelle BourgeoisMarie Williams, CNM

## 2017-10-02 NOTE — Patient Instructions (Signed)

## 2017-10-02 NOTE — Progress Notes (Signed)
Note created by Epic in error.  See other note.

## 2017-10-16 ENCOUNTER — Ambulatory Visit (INDEPENDENT_AMBULATORY_CARE_PROVIDER_SITE_OTHER): Payer: Medicaid Other | Admitting: Advanced Practice Midwife

## 2017-10-16 ENCOUNTER — Other Ambulatory Visit (HOSPITAL_COMMUNITY)
Admission: RE | Admit: 2017-10-16 | Discharge: 2017-10-16 | Disposition: A | Discharge status: Home / Self Care | Payer: Medicaid Other | Source: Ambulatory Visit | Attending: Obstetrics & Gynecology | Admitting: Obstetrics & Gynecology

## 2017-10-16 VITALS — BP 107/65 | HR 98 | Wt 136.0 lb

## 2017-10-16 DIAGNOSIS — O24410 Gestational diabetes mellitus in pregnancy, diet controlled: Secondary | ICD-10-CM

## 2017-10-16 DIAGNOSIS — Z3490 Encounter for supervision of normal pregnancy, unspecified, unspecified trimester: Secondary | ICD-10-CM

## 2017-10-16 DIAGNOSIS — Z3493 Encounter for supervision of normal pregnancy, unspecified, third trimester: Secondary | ICD-10-CM | POA: Diagnosis not present

## 2017-10-16 DIAGNOSIS — O09299 Supervision of pregnancy with other poor reproductive or obstetric history, unspecified trimester: Secondary | ICD-10-CM

## 2017-10-16 DIAGNOSIS — Z789 Other specified health status: Secondary | ICD-10-CM

## 2017-10-16 NOTE — Patient Instructions (Signed)

## 2017-10-16 NOTE — Progress Notes (Signed)
   PRENATAL VISIT NOTE  Subjective:  Janice Hines is a 37 y.o. G4P3003 at 2018w4d being seen today for ongoing prenatal care.  She is currently monitored for the following issues for this high-risk pregnancy and has Gestational diabetes mellitus (GDM), antepartum; Pregnancy; AMA (advanced maternal age) multigravida 35+, unspecified trimester; Language barrier; H/O macrosomia in infant in prior pregnancy, currently pregnant; and Rash on their problem list.  Patient reports no complaints.  Contractions: Irritability. Vag. Bleeding: None.  Movement: Present. Denies leaking of fluid.   The following portions of the patient's history were reviewed and updated as appropriate: allergies, current medications, past family history, past medical history, past social history, past surgical history and problem list. Problem list updated.  Objective:   Vitals:   10/16/17 1048  BP: 107/65  Pulse: 98  Weight: 136 lb (61.7 kg)    Fetal Status: Fetal Heart Rate (bpm): 150   Movement: Present  Presentation: Vertex  General:  Alert, oriented and cooperative. Patient is in no acute distress.  Skin: Skin is warm and dry. No rash noted.   Cardiovascular: Normal heart rate noted  Respiratory: Normal respiratory effort, no problems with respiration noted  Abdomen: Soft, gravid, appropriate for gestational age.  Pain/Pressure: Present     Pelvic: Cervical exam performed Dilation: 3 Effacement (%): 30 Station: -3  Extremities: Normal range of motion.  Edema: None  Mental Status: Normal mood and affect. Normal behavior. Normal judgment and thought content.   Assessment and Plan:  Pregnancy: G4P3003 at 5018w4d  1. Prenatal care, antepartum  - GC[facility]Chlamydia probe amp [facility] - Culture, beta strep (group b only)  2. Diet controlled gestational diabetes mellitus (GDM) in third trimester --Reviewed glucose log.  Fastings 77-90, PP mostly 80s, 90s with 2 outliers 125 and 130.  No changes to plan, glucose  well controlled with diet currently.  3. H/O macrosomia in infant in prior pregnancy, currently pregnant --Pt with 8+ and 9+ lbs babies.  With GDM, will do growth US at 37 weeks, scheduled.  4. Language barrier --Burmese. Pt prefers niece as Nurse, learning disabilitytranslator and declines Lawyerlanguage line.  Niece is present for visit today.  Term labor symptoms and general obstetric precautions including but not limited to vaginal bleeding, contractions, leaking of fluid and fetal movement were reviewed in detail with the patient. Please refer to After Visit Summary for other counseling recommendations.  Return in about 1 week (around 10/23/2017).  Future Appointments  Date Time Provider Department Center  10/24/2017  9:45 AM [facility] US 5 [facility]    Sharen CounterLisa Leftwich-Kirby, CNM

## 2017-10-18 LAB — GC/CHLAMYDIA PROBE AMP (~~LOC~~) NOT AT ARMC
CHLAMYDIA, DNA PROBE: NEGATIVE
NEISSERIA GONORRHEA: NEGATIVE

## 2017-10-20 LAB — CULTURE, BETA STREP (GROUP B ONLY): Strep Gp B Culture: NEGATIVE

## 2017-10-23 ENCOUNTER — Encounter: Payer: Self-pay | Admitting: Advanced Practice Midwife

## 2017-10-23 ENCOUNTER — Ambulatory Visit (INDEPENDENT_AMBULATORY_CARE_PROVIDER_SITE_OTHER): Payer: Medicaid Other | Admitting: Advanced Practice Midwife

## 2017-10-23 VITALS — BP 107/64 | HR 87 | Wt 136.0 lb

## 2017-10-23 DIAGNOSIS — O24410 Gestational diabetes mellitus in pregnancy, diet controlled: Secondary | ICD-10-CM

## 2017-10-23 NOTE — Patient Instructions (Signed)

## 2017-10-23 NOTE — Progress Notes (Deleted)
   PRENATAL VISIT NOTE  Subjective:  Janice Hines is a 37 y.o. G9F6213 at [redacted]w[redacted]d being seen today for ongoing prenatal care.  She is currently monitored for the following issues for this {Blank single:19197::"high-risk","low-risk"} pregnancy and has Gestational diabetes mellitus (GDM), antepartum; Pregnancy; AMA (advanced maternal age) multigravida 35+, unspecified trimester; Language barrier; H/O macrosomia in infant in prior pregnancy, currently pregnant; and Rash on their problem list.  Patient reports {sx:[id number]}.  Contractions: Irritability. Vag. Bleeding: None.  Movement: Present. Denies leaking of fluid.   The following portions of the patient's history were reviewed and updated as appropriate: allergies, current medications, past family history, past medical history, past social history, past surgical history and problem list. Problem list updated.  Objective:   Vitals:   10/23/17 1110  BP: 107/64  Pulse: 87  Weight: 136 lb (61.7 kg)    Fetal Status:     Movement: Present     General:  Alert, oriented and cooperative. Patient is in no acute distress.  Skin: Skin is warm and dry. No rash noted.   Cardiovascular: Normal heart rate noted  Respiratory: Normal respiratory effort, no problems with respiration noted  Abdomen: Soft, gravid, appropriate for gestational age.  Pain/Pressure: Present     Pelvic: {Blank single:19197::"Cervical exam performed","Cervical exam deferred"}        Extremities: Normal range of motion.  Edema: None  Mental Status: Normal mood and affect. Normal behavior. Normal judgment and thought content.   Assessment and Plan:  Pregnancy: G4P3003 at [redacted]w[redacted]d  1. Diet controlled gestational diabetes mellitus (GDM) in third trimester ***  {Blank single:19197::"Term","Preterm"} labor symptoms and general obstetric precautions including but not limited to vaginal bleeding, contractions, leaking of fluid and fetal movement were reviewed in detail with the patient. Please  refer to After Visit Summary for other counseling recommendations.  Return in about 1 week (around 10/30/2017) for [facility].  Future Appointments  Date Time Provider Department Center  10/24/2017  9:45 AM [facility] [place] 5 [facility]  10/30/2017  9:40 AM Dorathy Kinsman, CNM [facility] None  11/05/2017 10:45 AM Willodean Rosenthal, MD [facility] None    Wynelle Bourgeois, CNM

## 2017-10-23 NOTE — Progress Notes (Signed)
   PRENATAL VISIT NOTE  Subjective:  Janice Hines is a 37 y.o. [id number] at [redacted]w[redacted]d being seen today for ongoing prenatal care.  She is currently monitored for the following issues for this high-risk pregnancy and has Gestational diabetes mellitus (GDM), antepartum; Pregnancy; AMA (advanced maternal age) multigravida 35+, unspecified trimester; Language barrier; H/O macrosomia in infant in prior pregnancy, currently pregnant; and Rash on their problem list.  Patient reports occasional contractions.  Contractions: Irritability. Vag. Bleeding: None.  Movement: Present. Denies leaking of fluid.   The following portions of the patient's history were reviewed and updated as appropriate: allergies, current medications, past family history, past medical history, past social history, past surgical history and problem list. Problem list updated.  Objective:   Vitals:   10/23/17 1110  BP: 107/64  Pulse: 87  Weight: 136 lb (61.7 kg)    Fetal Status:     Movement: Present     General:  Alert, oriented and cooperative. Patient is in no acute distress.  Skin: Skin is warm and dry. No rash noted.   Cardiovascular: Normal heart rate noted  Respiratory: Normal respiratory effort, no problems with respiration noted  Abdomen: Soft, gravid, appropriate for gestational age.  Pain/Pressure: Present     Pelvic:       Cervix 3/70/-2/vertex  Extremities: Normal range of motion.  Edema: None  Mental Status: Normal mood and affect. Normal behavior. Normal judgment and thought content.   Assessment and Plan:  Pregnancy: G4P3003 at [redacted]w[redacted]d  1. Diet controlled gestational diabetes mellitus (GDM) in third trimester FBS 74-82, none out of range PC values 81-128  (only 1 out of range out of 14 listed)  2.   Term pregnancy     Reviewed signs of labor.  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   3.   History of macrosomia          Has growth [place] tomorrow.  EFW in my opinion is about 7.5 lbs.  Fundal height is 38.  Term labor symptoms  and general obstetric precautions including but not limited to vaginal bleeding, contractions, leaking of fluid and fetal movement were reviewed in detail with the patient. Please refer to After Visit Summary for other counseling recommendations.  Return in about 1 week (around 10/30/2017) for [facility]  Future Appointments  Date Time Provider Department Center  10/24/2017  9:45 AM [facility] [place] 5 [facility]  10/30/2017  9:40 AM Dorathy Kinsman, CNM [facility] None  11/05/2017 10:45 AM Willodean Rosenthal, MD [facility] None    Wynelle Bourgeois, CNM

## 2017-10-24 ENCOUNTER — Encounter (HOSPITAL_COMMUNITY): Payer: Self-pay

## 2017-10-24 ENCOUNTER — Ambulatory Visit (HOSPITAL_COMMUNITY)
Admission: RE | Admit: 2017-10-24 | Discharge: 2017-10-24 | Disposition: A | Discharge status: Home / Self Care | Payer: Medicaid Other | Source: Ambulatory Visit | Attending: Advanced Practice Midwife | Admitting: Advanced Practice Midwife

## 2017-10-24 ENCOUNTER — Other Ambulatory Visit: Payer: Self-pay | Admitting: Advanced Practice Midwife

## 2017-10-24 DIAGNOSIS — O09523 Supervision of elderly multigravida, third trimester: Secondary | ICD-10-CM | POA: Insufficient documentation

## 2017-10-24 DIAGNOSIS — O24410 Gestational diabetes mellitus in pregnancy, diet controlled: Secondary | ICD-10-CM | POA: Diagnosis present

## 2017-10-24 DIAGNOSIS — Z362 Encounter for other antenatal screening follow-up: Secondary | ICD-10-CM

## 2017-10-24 DIAGNOSIS — Z3A37 37 weeks gestation of pregnancy: Secondary | ICD-10-CM | POA: Insufficient documentation

## 2017-10-24 HISTORY — DX: Gestational diabetes mellitus in pregnancy, unspecified control: O24.419

## 2017-10-24 NOTE — Addendum Note (Signed)
Encounter addended by: Levonne Hubert, RDMS, RVT on: 10/24/2017 11:29 AM  Actions taken: Imaging Exam ended

## 2017-10-30 ENCOUNTER — Encounter: Payer: Self-pay | Admitting: Advanced Practice Midwife

## 2017-10-30 ENCOUNTER — Ambulatory Visit (INDEPENDENT_AMBULATORY_CARE_PROVIDER_SITE_OTHER): Payer: Medicaid Other | Admitting: Advanced Practice Midwife

## 2017-10-30 VITALS — BP 107/64 | HR 90 | Wt 135.0 lb

## 2017-10-30 DIAGNOSIS — O09299 Supervision of pregnancy with other poor reproductive or obstetric history, unspecified trimester: Secondary | ICD-10-CM

## 2017-10-30 DIAGNOSIS — O24410 Gestational diabetes mellitus in pregnancy, diet controlled: Secondary | ICD-10-CM

## 2017-10-30 DIAGNOSIS — Z789 Other specified health status: Secondary | ICD-10-CM

## 2017-10-30 DIAGNOSIS — O09529 Supervision of elderly multigravida, unspecified trimester: Secondary | ICD-10-CM

## 2017-10-30 DIAGNOSIS — O09293 Supervision of pregnancy with other poor reproductive or obstetric history, third trimester: Secondary | ICD-10-CM

## 2017-10-30 DIAGNOSIS — Z23 Encounter for immunization: Secondary | ICD-10-CM | POA: Diagnosis not present

## 2017-10-30 DIAGNOSIS — O09523 Supervision of elderly multigravida, third trimester: Secondary | ICD-10-CM

## 2017-10-30 DIAGNOSIS — O0990 Supervision of high risk pregnancy, unspecified, unspecified trimester: Secondary | ICD-10-CM

## 2017-10-30 NOTE — Progress Notes (Signed)
   PRENATAL VISIT NOTE  Subjective:  Janice Hines is a 37 y.o. G4P3003 at [redacted]w[redacted]d being seen today for ongoing prenatal care.  She is currently monitored for the following issues for this high-risk pregnancy and has Gestational diabetes mellitus (GDM), antepartum; Supervision of high risk pregnancy, antepartum; AMA (advanced maternal age) multigravida 35+, unspecified trimester; Language barrier; H/O macrosomia in infant in prior pregnancy, currently pregnant; and Rash on their problem list.  Patient reports no complaints.  Contractions: Irritability. Vag. Bleeding: None.  Movement: Present. Denies leaking of fluid.   The following portions of the patient's history were reviewed and updated as appropriate: allergies, current medications, past family history, past medical history, past social history, past surgical history and problem list. Problem list updated.  Objective:   Vitals:   10/30/17 1004  BP: 107/64  Pulse: 90  Weight: 135 lb (61.2 kg)    Fetal Status: Fetal Heart Rate (bpm): 145   Movement: Present     General:  Alert, oriented and cooperative. Patient is in no acute distress.  Skin: Skin is warm and dry. No rash noted.   Cardiovascular: Normal heart rate noted  Respiratory: Normal respiratory effort, no problems with respiration noted  Abdomen: Soft, gravid, appropriate for gestational age.  Pain/Pressure: Present     Pelvic: Cervical exam deferred        Extremities: Normal range of motion.  Edema: None  Mental Status: Normal mood and affect. Normal behavior. Normal judgment and thought content.   All CBGs in range  Assessment and Plan:  Pregnancy: G4P3003 at [redacted]w[redacted]d  1. AMA (advanced maternal age) multigravida 35+, unspecified trimester   2. Diet controlled gestational diabetes mellitus (GDM), antepartum - Continue diet - IOL 40 weeks  3. H/O macrosomia in infant in prior pregnancy, currently pregnant - Nml growth [place]   4. Language barrier   5. Supervision of high  risk pregnancy, antepartum - TDaP - Plans BTL  Term labor symptoms and general obstetric precautions including but not limited to vaginal bleeding, contractions, leaking of fluid and fetal movement were reviewed in detail with the patient. Please refer to After Visit Summary for other counseling recommendations.  No follow-ups on file.  Future Appointments  Date Time Provider Department Center  11/05/2017 10:45 AM Willodean Rosenthal, MD [facility] None    Dorathy Kinsman, CNM

## 2017-10-30 NOTE — Progress Notes (Signed)
tdap

## 2017-10-30 NOTE — Patient Instructions (Signed)
Postpartum Tubal Ligation Postpartum tubal ligation (PPTL) is a procedure to close the fallopian tubes. This is done so that you cannot get pregnant. When the fallopian tubes are closed, the eggs that the ovaries release cannot enter the uterus, and sperm cannot reach the eggs. PPTL is done right after childbirth or 1-2 days after childbirth, before the uterus returns to its normal location. PPTL is sometimes called "getting your tubes tied." You should not have this procedure if you want to get pregnant someday or if you are unsure about having more children. Tell a health care provider about:  Any allergies you have.  All medicines you are taking, including vitamins, herbs, eye drops, creams, and over-the-counter medicines.  Previous problems you or members of your family have had with the use of anesthetics.  Any blood disorders you have.  Previous surgeries you have had.  Any medical conditions you may have.  Any past pregnancies. What are the risks? Generally, this is a safe procedure. However, problems may occur, including:  Infection.  Bleeding.  Injury to surrounding organs.  Side effects from anesthetics.  Failure of the procedure.  This procedure can increase your risk of a kind of pregnancy in which a fertilized egg attaches to the outside of the uterus (ectopic pregnancy). What happens before the procedure?  Ask your health care provider about: ? How much pain you can expect to have. ? What medicines you will be given for pain, especially if you are planning to breastfeed.  Follow instructions from your health care provider about eating and drinking restrictions. What happens during the procedure? If you had a vaginal delivery:  You may be given one or more of the following: ? A medicine that helps you relax (sedative). ? A medicine to numb the area (local anesthetic). ? A medicine to make you fall asleep (general anesthetic). ? A medicine that is injected  into an area of your body to numb everything below the injection site (regional anesthetic).  If you have been given a general anesthetic, a tube will be put down your throat to help you breathe.  An IV tube will be inserted into one of your veins to give you medicines and fluids during the procedure.  Your bladder may be emptied with a small tube (catheter).  An incision will be made just below your belly button.  Your fallopian tubes will be located and brought up through the incision.  Your fallopian tubes will be tied off, burned (cauterized), or blocked with a clip, ring, or clamp. A small portion in the center of each fallopian tube may be removed.  The incision will be closed with stitches (sutures).  A bandage (dressing) will be placed over the incision.  If you had a cesarean delivery:  Tubal ligation will be done through the incision that was used for the cesarean delivery of your baby.  The incision will be closed with sutures.  A dressing will be placed over the incision.  The procedure may vary among health care providers and hospitals. What happens after the procedure?  Your blood pressure, heart rate, breathing rate, and blood oxygen level will be monitored often until the medicines you were given have worn off.  You will be given pain medicine as needed.  Do not drive for 24 hours if you received a sedative. This information is not intended to replace advice given to you by your health care provider. Make sure you discuss any questions you have with your health  care provider. Document Released: 06/12/2005 Document Revised: 11/15/2015 Document Reviewed: 05/23/2015 Elsevier Interactive Patient Education  2018 [employer].   TDaP Vaccine Pregnancy Get the Whooping Cough Vaccine While You Are Pregnant (CDC)  It is important for women to get the whooping cough vaccine in the third trimester of each pregnancy. Vaccines are the best way to prevent this disease.  There are 2 different whooping cough vaccines. Both vaccines combine protection against whooping cough, tetanus and diphtheria, but they are for different age groups: Tdap: for everyone 11 years or older, including pregnant women  DTaP: for children 2 months through 34 years of age  You need the whooping cough vaccine during each of your pregnancies The recommended time to get the shot is during your 27th through 36th week of pregnancy, preferably during the earlier part of this time period. The Centers for Disease Control and Prevention (CDC) recommends that pregnant women receive the whooping cough vaccine for adolescents and adults (called Tdap vaccine) during the third trimester of each pregnancy. The recommended time to get the shot is during your 27th through 36th week of pregnancy, preferably during the earlier part of this time period. This replaces the original recommendation that pregnant women get the vaccine only if they had not previously received it. The [employer] of [employer]st[employer]ians and Gynecologists and the [employer] support this recommendation.  You should get the whooping cough vaccine while pregnant to pass protection to your baby frame support disabled and/or not supported in this browser  Learn why Vernona Rieger decided to get the whooping cough vaccine in her 3rd trimester of pregnancy and how her baby girl was born with some protection against the disease. Also available on YouTube. After receiving the whooping cough vaccine, your body will create protective antibodies (proteins produced by the body to fight off diseases) and pass some of them to your baby before birth. These antibodies provide your baby some short-term protection against whooping cough in early life. These antibodies can also protect your baby from some of the more serious complications that come along with whooping cough. Your protective antibodies are at their highest about 2 weeks after  getting the vaccine, but it takes time to pass them to your baby. So the preferred time to get the whooping cough vaccine is early in your third trimester. The amount of whooping cough antibodies in your body decreases over time. That is why CDC recommends you get a whooping cough vaccine during each pregnancy. Doing so allows each of your babies to get the greatest number of protective antibodies from you. This means each of your babies will get the best protection possible against this disease.  Getting the whooping cough vaccine while pregnant is better than getting the vaccine after you give birth Whooping cough vaccination during pregnancy is ideal so your baby will have short-term protection as soon as he is born. This early protection is important because your baby will not start getting his whooping cough vaccines until he is 2 months old. These first few months of life are when your baby is at greatest risk for catching whooping cough. This is also when he's at greatest risk for having severe, potentially life-threating complications from the infection. To avoid that gap in protection, it is best to get a whooping cough vaccine during pregnancy. You will then pass protection to your baby before he is born. To continue protecting your baby, he should get whooping cough vaccines starting at 2 months old. You  may never have gotten the Tdap vaccine before and did not get it during this pregnancy. If so, you should make sure to get the vaccine immediately after you give birth, before leaving the hospital or birthing center. It will take about 2 weeks before your body develops protection (antibodies) in response to the vaccine. Once you have protection from the vaccine, you are less likely to give whooping cough to your newborn while caring for him. But remember, your baby will still be at risk for catching whooping cough from others. A recent study looked to see how effective Tdap was at preventing  whooping cough in babies whose mothers got the vaccine while pregnant or in the hospital after giving birth. The study found that getting Tdap between 27 through 36 weeks of pregnancy is 85% more effective at preventing whooping cough in babies younger than 2 months old. Blood tests cannot tell if you need a whooping cough vaccine There are no blood tests that can tell you if you have enough antibodies in your body to protect yourself or your baby against whooping cough. Even if you have been sick with whooping cough in the past or previously received the vaccine, you still should get the vaccine during each pregnancy. Breastfeeding may pass some protective antibodies onto your baby By breastfeeding, you may pass some antibodies you have made in response to the vaccine to your baby. When you get a whooping cough vaccine during your pregnancy, you will have antibodies in your breast milk that you can share with your baby as soon as your milk comes in. However, your baby will not get protective antibodies immediately if you wait to get the whooping cough vaccine until after delivering your baby. This is because it takes about 2 weeks for your body to create antibodies. Learn more about the health benefits of breastfeeding.

## 2017-10-30 NOTE — Progress Notes (Incomplete)
   PRENATAL VISIT NOTE  Subjective:  Janice Hines is a 37 y.o. G4P3003 at [redacted]w[redacted]d being seen today for ongoing prenatal care.  She is currently monitored for the following issues for this {Blank single:19197::"high-risk","low-risk"} pregnancy and has Gestational diabetes mellitus (GDM), antepartum; Supervision of high risk pregnancy, antepartum; AMA (advanced maternal age) multigravida 35+, unspecified trimester; Language barrier; H/O macrosomia in infant in prior pregnancy, currently pregnant; and Rash on their problem list.  Patient reports {[id number]}.  Contractions: Irritability. Vag. Bleeding: None.  Movement: Present. Denies leaking of fluid.   The following portions of the patient's history were reviewed and updated as appropriate: allergies, current medications, past family history, past medical history, past social history, past surgical history and problem list. Problem list updated.  Objective:   Vitals:   10/30/17 1004  BP: 107/64  Pulse: 90  Weight: 135 lb (61.2 kg)    Fetal Status: Fetal Heart Rate (bpm): 145   Movement: Present     General:  Alert, oriented and cooperative. Patient is in no acute distress.  Skin: Skin is warm and dry. No rash noted.   Cardiovascular: Normal heart rate noted  Respiratory: Normal respiratory effort, no problems with respiration noted  Abdomen: Soft, gravid, appropriate for gestational age.  Pain/Pressure: Present     Pelvic: {Blank single:19197::"Cervical exam performed","Cervical exam deferred"}        Extremities: Normal range of motion.  Edema: None  Mental Status: Normal mood and affect. Normal behavior. Normal judgment and thought content.   Assessment and Plan:  Pregnancy: G4P3003 at [redacted]w[redacted]d  1. AMA (advanced maternal age) multigravida 35+, unspecified trimester ***  2. Diet controlled gestational diabetes mellitus (GDM), antepartum ***  3. H/O macrosomia in infant in prior pregnancy, currently pregnant ***  4. Language  barrier ***  5. Supervision of high risk pregnancy, antepartum ***  {Blank single:19197::"Term","Preterm"} labor symptoms and general obstetric precautions including but not limited to vaginal bleeding, contractions, leaking of fluid and fetal movement were reviewed in detail with the patient. Please refer to After Visit Summary for other counseling recommendations.  No follow-ups on file.  Future Appointments  Date Time Provider Department Center  11/05/2017 10:45 AM Willodean Rosenthal, MD [facility] None    Dorathy Kinsman, CNM

## 2017-10-31 ENCOUNTER — Telehealth (HOSPITAL_COMMUNITY): Payer: Self-pay | Admitting: *Deleted

## 2017-10-31 NOTE — Telephone Encounter (Signed)
Preadmission screen Interpreter number [phone]

## 2017-11-01 ENCOUNTER — Telehealth (HOSPITAL_COMMUNITY): Payer: Self-pay | Admitting: *Deleted

## 2017-11-01 NOTE — Telephone Encounter (Signed)
[id number] interpreter number  Preadmission screen

## 2017-11-05 ENCOUNTER — Ambulatory Visit (INDEPENDENT_AMBULATORY_CARE_PROVIDER_SITE_OTHER): Payer: Medicaid Other | Admitting: Obstetrics & Gynecology

## 2017-11-05 ENCOUNTER — Encounter: Payer: Self-pay | Admitting: Obstetrics & Gynecology

## 2017-11-05 ENCOUNTER — Other Ambulatory Visit: Payer: Self-pay

## 2017-11-05 ENCOUNTER — Encounter (HOSPITAL_COMMUNITY): Payer: Self-pay

## 2017-11-05 ENCOUNTER — Inpatient Hospital Stay (HOSPITAL_COMMUNITY)
Admission: AD | Admit: 2017-11-05 | Discharge: 2017-11-07 | DRG: 807 | Disposition: A | Discharge status: Home / Self Care | Payer: Medicaid Other | Source: Ambulatory Visit | Attending: Obstetrics & Gynecology | Admitting: Obstetrics & Gynecology

## 2017-11-05 VITALS — BP 109/74 | HR 78 | Wt 137.4 lb

## 2017-11-05 DIAGNOSIS — Z789 Other specified health status: Secondary | ICD-10-CM

## 2017-11-05 DIAGNOSIS — O43123 Velamentous insertion of umbilical cord, third trimester: Secondary | ICD-10-CM | POA: Diagnosis present

## 2017-11-05 DIAGNOSIS — O24415 Gestational diabetes mellitus in pregnancy, controlled by oral hypoglycemic drugs: Secondary | ICD-10-CM

## 2017-11-05 DIAGNOSIS — O09529 Supervision of elderly multigravida, unspecified trimester: Secondary | ICD-10-CM

## 2017-11-05 DIAGNOSIS — O24419 Gestational diabetes mellitus in pregnancy, unspecified control: Secondary | ICD-10-CM | POA: Diagnosis present

## 2017-11-05 DIAGNOSIS — Z3A39 39 weeks gestation of pregnancy: Secondary | ICD-10-CM

## 2017-11-05 DIAGNOSIS — O24420 Gestational diabetes mellitus in childbirth, diet controlled: Secondary | ICD-10-CM | POA: Diagnosis present

## 2017-11-05 DIAGNOSIS — O0990 Supervision of high risk pregnancy, unspecified, unspecified trimester: Secondary | ICD-10-CM

## 2017-11-05 DIAGNOSIS — O09299 Supervision of pregnancy with other poor reproductive or obstetric history, unspecified trimester: Secondary | ICD-10-CM

## 2017-11-05 LAB — CBC
HCT: 35.8 % — ABNORMAL LOW (ref 36.0–46.0)
HEMOGLOBIN: 12 g/dL (ref 12.0–15.0)
MCH: 28.4 pg (ref 26.0–34.0)
MCHC: 33.5 g/dL (ref 30.0–36.0)
MCV: 84.8 fL (ref 78.0–100.0)
Platelets: 189 10*3/uL (ref 150–400)
RBC: 4.22 MIL/uL (ref 3.87–5.11)
RDW: 13.9 % (ref 11.5–15.5)
WBC: 11.1 10*3/uL — ABNORMAL HIGH (ref 4.0–10.5)

## 2017-11-05 LAB — GLUCOSE, CAPILLARY: Glucose-Capillary: 82 mg/dL (ref 65–99)

## 2017-11-05 LAB — TYPE AND SCREEN
ABO/RH(D): AB POS
ANTIBODY SCREEN: NEGATIVE

## 2017-11-05 MED ORDER — OXYTOCIN 40 UNITS IN LACTATED RINGERS INFUSION - SIMPLE MED: 1.0000 m[IU]/min | INTRAVENOUS | Status: DC

## 2017-11-05 MED ORDER — FENTANYL CITRATE (PF) 100 MCG/2ML IJ SOLN: 100.0000 ug | INTRAMUSCULAR | Status: DC | PRN

## 2017-11-05 MED ORDER — LACTATED RINGERS IV SOLN
500.0000 mL | INTRAVENOUS | Status: DC | PRN
  Administered 2017-11-06: 1000 mL via INTRAVENOUS

## 2017-11-05 MED ORDER — SOD CITRATE-CITRIC ACID 500-334 MG/5ML PO SOLN: 30.0000 mL | ORAL | Status: DC | PRN

## 2017-11-05 MED ORDER — OXYTOCIN 40 UNITS IN LACTATED RINGERS INFUSION - SIMPLE MED: 2.5000 [IU]/h | INTRAVENOUS | Status: DC

## 2017-11-05 MED ORDER — OXYCODONE-ACETAMINOPHEN 5-325 MG PO TABS: 1.0000 | ORAL_TABLET | ORAL | Status: DC | PRN

## 2017-11-05 MED ORDER — LACTATED RINGERS IV SOLN
INTRAVENOUS | Status: DC
Start: 2017-11-05 — End: 2017-11-06
  Administered 2017-11-05: 23:00:00 via INTRAVENOUS

## 2017-11-05 MED ORDER — ONDANSETRON HCL 4 MG/2ML IJ SOLN: 4.0000 mg | Freq: Four times a day (QID) | INTRAMUSCULAR | Status: DC | PRN

## 2017-11-05 MED ORDER — LIDOCAINE HCL (PF) 1 % IJ SOLN
30.0000 mL | INTRAMUSCULAR | Status: DC | PRN
  Filled 2017-11-05: qty 30

## 2017-11-05 MED ORDER — OXYTOCIN BOLUS FROM INFUSION
500.0000 mL | Freq: Once | INTRAVENOUS | Status: AC
  Administered 2017-11-06: 500 mL via INTRAVENOUS

## 2017-11-05 MED ORDER — FLEET ENEMA 7-19 GM/118ML RE ENEM: 1.0000 | ENEMA | RECTAL | Status: DC | PRN

## 2017-11-05 MED ORDER — LACTATED RINGERS IV SOLN: 500.0000 mL | INTRAVENOUS | Status: DC | PRN

## 2017-11-05 MED ORDER — OXYCODONE-ACETAMINOPHEN 5-325 MG PO TABS: 2.0000 | ORAL_TABLET | ORAL | Status: DC | PRN

## 2017-11-05 MED ORDER — TERBUTALINE SULFATE 1 MG/ML IJ SOLN
0.2500 mg | Freq: Once | INTRAMUSCULAR | Status: DC | PRN
  Filled 2017-11-05: qty 1

## 2017-11-05 MED ORDER — LIDOCAINE HCL (PF) 1 % IJ SOLN: 30.0000 mL | INTRAMUSCULAR | Status: DC | PRN

## 2017-11-05 MED ORDER — LACTATED RINGERS IV SOLN
INTRAVENOUS | Status: DC
  Administered 2017-11-06: 03:00:00 via INTRAVENOUS

## 2017-11-05 MED ORDER — ACETAMINOPHEN 325 MG PO TABS
650.0000 mg | ORAL_TABLET | ORAL | Status: DC | PRN
  Administered 2017-11-06: 650 mg via ORAL
  Filled 2017-11-05: qty 2

## 2017-11-05 MED ORDER — OXYTOCIN BOLUS FROM INFUSION: 500.0000 mL | Freq: Once | INTRAVENOUS | Status: DC

## 2017-11-05 MED ORDER — ACETAMINOPHEN 325 MG PO TABS: 650.0000 mg | ORAL_TABLET | ORAL | Status: DC | PRN

## 2017-11-05 MED ORDER — OXYTOCIN 40 UNITS IN LACTATED RINGERS INFUSION - SIMPLE MED
2.5000 [IU]/h | INTRAVENOUS | Status: DC
  Administered 2017-11-06: 2.5 [IU]/h via INTRAVENOUS
  Filled 2017-11-05: qty 1000

## 2017-11-05 NOTE — Patient Instructions (Signed)
Labor Induction Labor induction is when steps are taken to cause a pregnant woman to begin the labor process. Most women go into labor on their own between 37 weeks and 42 weeks of the pregnancy. When this does not happen or when there is a medical need, methods may be used to induce labor. Labor induction causes a pregnant woman's uterus to contract. It also causes the cervix to soften (ripen), open (dilate), and thin out (efface). Usually, labor is not induced before 39 weeks of the pregnancy unless there is a problem with the baby or mother. Before inducing labor, your health care provider will consider a number of factors, including the following:  The medical condition of you and the baby.  How many weeks along you are.  The status of the baby's lung maturity.  The condition of the cervix.  The position of the baby. What are the reasons for labor induction? Labor may be induced for the following reasons:  The health of the baby or mother is at risk.  The pregnancy is overdue by 1 week or more.  The water breaks but labor does not start on its own.  The mother has a health condition or serious illness, such as high blood pressure, infection, placental abruption, or diabetes.  The amniotic fluid amounts are low around the baby.  The baby is distressed. Convenience or wanting the baby to be born on a certain date is not a reason for inducing labor. What methods are used for labor induction? Several methods of labor induction may be used, such as:  Prostaglandin medicine. This medicine causes the cervix to dilate and ripen. The medicine will also start contractions. It can be taken by mouth or by inserting a suppository into the vagina.  Inserting a thin tube (catheter) with a balloon on the end into the vagina to dilate the cervix. Once inserted, the balloon is expanded with water, which causes the cervix to open.  Stripping the membranes. Your health care provider separates  amniotic sac tissue from the cervix, causing the cervix to be stretched and causing the release of a hormone called progesterone. This may cause the uterus to contract. It is often done during an office visit. You will be sent home to wait for the contractions to begin. You will then come in for an induction.  Breaking the water. Your health care provider makes a hole in the amniotic sac using a small instrument. Once the amniotic sac breaks, contractions should begin. This may still take hours to see an effect.  Medicine to trigger or strengthen contractions. This medicine is given through an IV access tube inserted into a vein in your arm. All of the methods of induction, besides stripping the membranes, will be done in the hospital. Induction is done in the hospital so that you and the baby can be carefully monitored. How long does it take for labor to be induced? Some inductions can take up to 2-3 days. Depending on the cervix, it usually takes less time. It takes longer when you are induced early in the pregnancy or if this is your first pregnancy. If a mother is still pregnant and the induction has been going on for 2-3 days, either the mother will be sent home or a cesarean delivery will be needed. What are the risks associated with labor induction? Some of the risks of induction include:  Changes in fetal heart rate, such as too high, too low, or erratic.  Fetal distress.    Chance of infection for the mother and baby.  Increased chance of having a cesarean delivery.  Breaking off (abruption) of the placenta from the uterus (rare).  Uterine rupture (very rare). When induction is needed for medical reasons, the benefits of induction may outweigh the risks. What are some reasons for not inducing labor? Labor induction should not be done if:  It is shown that your baby does not tolerate labor.  You have had previous surgeries on your uterus, such as a myomectomy or the removal of  fibroids.  Your placenta lies very low in the uterus and blocks the opening of the cervix (placenta previa).  Your baby is not in a head-down position.  The umbilical cord drops down into the birth canal in front of the baby. This could cut off the baby's blood and oxygen supply.  You have had a previous cesarean delivery.  There are unusual circumstances, such as the baby being extremely premature. This information is not intended to replace advice given to you by your health care provider. Make sure you discuss any questions you have with your health care provider. Document Released: 11/01/2006 Document Revised: 11/18/2015 Document Reviewed: 01/09/2013 Elsevier Interactive Patient Education  2017 [employer]  

## 2017-11-05 NOTE — H&P (Addendum)
Janice Hines is a 37 y.o. female [phone] at [redacted]w[redacted]d being admitted for SOL  OB History    Gravida  4   Para  3   Term  3   Preterm      AB      Living  3     SAB      TAB      Ectopic      Multiple      Live Births  3          Past Medical History:  Diagnosis Date  . Diabetes mellitus without complication (HCC)   . Gestational diabetes    Past Surgical History:  Procedure Laterality Date  . NO PAST SURGERIES     Family History: family history is not on file. Social History:  reports that she has never smoked. She has never used smokeless tobacco. She reports that she does not drink alcohol or use drugs.     Maternal Diabetes: Yes:  Diabetes Type:  Insulin/Medication controlled, Diet controlled Genetic Screening: Normal Maternal Ultrasounds/Referrals: Normal Fetal Ultrasounds or other Referrals:  None Maternal Substance Abuse:  No Significant Maternal Medications:  Meds include: Other:  Significant Maternal Lab Results:  None Other Comments:  None  Review of Systems  Constitutional: Negative for chills and fever.  Cardiovascular: Negative for chest pain and palpitations.  Gastrointestinal: Positive for abdominal pain.  Genitourinary: Positive for hematuria. Negative for dysuria.   Maternal Medical History:  Reason for admission: Contractions.   Contractions: Onset was 1-2 hours ago.   Frequency: regular.   Perceived severity is moderate.    Fetal activity: Perceived fetal activity is normal.   Last perceived fetal movement was within the past hour.    Prenatal complications: no prenatal complications Prenatal Complications - Diabetes: gestational. Diabetes is managed by diet.      Dilation: 6 Effacement (%): 80, 90 Station: -2 Exam by:: B. Bowen, RN  Blood pressure 117/70, pulse 81, temperature 98.4 F (36.9 C), resp. rate 18, weight 61.7 kg (136 lb), last menstrual period 02/02/2017, SpO2 100 %. Maternal Exam:  Uterine Assessment: Contraction  strength is moderate.  Contraction frequency is regular.   Abdomen: Patient reports no abdominal tenderness.   Physical Exam  Prenatal labs: ABO, Rh: AB/Positive/-- (10/08 0000) Antibody: Negative (10/08 0000) Rubella: Immune (10/08 0000) RPR: Nonreactive (10/08 0000)  HBsAg: Negative (10/08 0000)  HIV: Non-reactive (10/08 0000)  GBS:   Negative  Assessment/Plan: Janice Hines is a 37 y.o. A5W0981 at [redacted]w[redacted]d being admitted for SOL   Plan:  Labor  - Admit to labor and delivery - Expectant management - Analgesia as needed  FWB  - Cat I fetal heart tracing   - GBS negative - last EFW 5/1:  2865  gm  GDM - FS Q4 - diabetic diet - SSI prn  Delivery plan - Hopeful for vaginal delivery   Contraception plan - BTL  Baby plan: - circumcision  Felisa Bonier, MD, PGY-1 [facility] [place] 11/05/2017, 10:36 PM  I confirm that I have verified the information documented in the resident's note and that I have also personally reperformed the physical exam and all medical decision making activities. The patient was seen and examined by me also Agree with note NST reactive and reassuring UCs as listed Cervical exams as listed in note Plan for expectant management for now Aviva Signs, CNM

## 2017-11-05 NOTE — Progress Notes (Signed)
   PRENATAL VISIT NOTE  Subjective:  Janice Hines is a 37 y.o. G4P3003 at [redacted]w[redacted]d being seen today for ongoing prenatal care.  She is currently monitored for the following issues for this high-risk pregnancy and has Gestational diabetes mellitus (GDM), antepartum; Supervision of high risk pregnancy, antepartum; AMA (advanced maternal age) multigravida 35+, unspecified trimester; Language barrier; H/O macrosomia in infant in prior pregnancy, currently pregnant; and Rash on their problem list.  Patient reports occasional contractions.  Contractions: Irregular. Vag. Bleeding: None.  Movement: Present. Denies leaking of fluid.   The following portions of the patient's history were reviewed and updated as appropriate: allergies, current medications, past family history, past medical history, past social history, past surgical history and problem list. Problem list updated.  Objective:   Vitals:   11/05/17 1051  BP: 109/74  Pulse: 78  Weight: 137 lb 6.4 oz (62.3 kg)    Fetal Status: Fetal Heart Rate (bpm): 146   Movement: Present     General:  Alert, oriented and cooperative. Patient is in no acute distress.  Skin: Skin is warm and dry. No rash noted.   Cardiovascular: Normal heart rate noted  Respiratory: Normal respiratory effort, no problems with respiration noted  Abdomen: Soft, gravid, appropriate for gestational age.  Pain/Pressure: Present     Pelvic: Cervical exam performed        Extremities: Normal range of motion.  Edema: None  Mental Status: Normal mood and affect. Normal behavior. Normal judgment and thought content.   Assessment and Plan:  Pregnancy: G4P3003 at [redacted]w[redacted]d  1. Supervision of high risk pregnancy, antepartum  2. Language barrier Live interpreter present  3. H/O macrosomia in infant in prior pregnancy, currently pregnant 10/24/2017 39th%ile  4. Gestational diabetes mellitus (GDM) controlled on oral hypoglycemic drug, antepartum Has IOL scheduled for 11/09/2017 All  except for 1 GLC level was WNL  5. AMA (advanced maternal age) multigravida 35+, unspecified trimester See above  Term labor symptoms and general obstetric precautions including but not limited to vaginal bleeding, contractions, leaking of fluid and fetal movement were reviewed in detail with the patient. Please refer to After Visit Summary for other counseling recommendations.  Return in about 1 week (around 11/12/2017).  Future Appointments  Date Time Provider Department Center  11/09/2017  7:30 AM [facility] ROOM [facility] None    Willodean Rosenthal, MD

## 2017-11-06 ENCOUNTER — Inpatient Hospital Stay (HOSPITAL_COMMUNITY): Payer: Medicaid Other | Admitting: Anesthesiology

## 2017-11-06 ENCOUNTER — Inpatient Hospital Stay (HOSPITAL_COMMUNITY): Payer: Medicaid Other | Admitting: Certified Registered Nurse Anesthetist

## 2017-11-06 ENCOUNTER — Encounter (HOSPITAL_COMMUNITY): Payer: Self-pay

## 2017-11-06 ENCOUNTER — Encounter (HOSPITAL_COMMUNITY)
Admission: AD | Disposition: A | Discharge status: Home / Self Care | Payer: Self-pay | Source: Ambulatory Visit | Attending: Obstetrics & Gynecology

## 2017-11-06 DIAGNOSIS — Z3A39 39 weeks gestation of pregnancy: Secondary | ICD-10-CM

## 2017-11-06 LAB — ABO/RH: ABO/RH(D): AB POS

## 2017-11-06 LAB — GLUCOSE, CAPILLARY: Glucose-Capillary: 80 mg/dL (ref 65–99)

## 2017-11-06 LAB — RPR: RPR Ser Ql: NONREACTIVE

## 2017-11-06 SURGERY — LIGATION, FALLOPIAN TUBE, POSTPARTUM
Anesthesia: Regional | Laterality: Bilateral

## 2017-11-06 MED ORDER — SENNOSIDES-DOCUSATE SODIUM 8.6-50 MG PO TABS
2.0000 | ORAL_TABLET | ORAL | Status: DC
  Administered 2017-11-06: 2 via ORAL
  Filled 2017-11-06: qty 2

## 2017-11-06 MED ORDER — ACETAMINOPHEN 325 MG PO TABS
650.0000 mg | ORAL_TABLET | ORAL | Status: DC | PRN
  Administered 2017-11-06: 650 mg via ORAL
  Filled 2017-11-06: qty 2

## 2017-11-06 MED ORDER — BENZOCAINE-MENTHOL 20-0.5 % EX AERO
1.0000 "application " | INHALATION_SPRAY | CUTANEOUS | Status: DC | PRN
  Administered 2017-11-06: 1 via TOPICAL
  Filled 2017-11-06: qty 56

## 2017-11-06 MED ORDER — ONDANSETRON HCL 4 MG/2ML IJ SOLN: 4.0000 mg | INTRAMUSCULAR | Status: DC | PRN

## 2017-11-06 MED ORDER — COCONUT OIL OIL
1.0000 "application " | TOPICAL_OIL | Status: DC | PRN
  Administered 2017-11-07: 1 via TOPICAL
  Filled 2017-11-06: qty 120

## 2017-11-06 MED ORDER — EPHEDRINE 5 MG/ML INJ
10.0000 mg | INTRAVENOUS | Status: DC | PRN
  Filled 2017-11-06: qty 2

## 2017-11-06 MED ORDER — IBUPROFEN 600 MG PO TABS
600.0000 mg | ORAL_TABLET | Freq: Four times a day (QID) | ORAL | Status: DC
  Administered 2017-11-06 – 2017-11-07 (×5): 600 mg via ORAL
  Filled 2017-11-06 (×5): qty 1

## 2017-11-06 MED ORDER — LACTATED RINGERS IV SOLN
500.0000 mL | Freq: Once | INTRAVENOUS | Status: AC
  Administered 2017-11-06: 500 mL via INTRAVENOUS

## 2017-11-06 MED ORDER — FENTANYL 2.5 MCG/ML BUPIVACAINE 1/10 % EPIDURAL INFUSION (WH - ANES)
14.0000 mL/h | INTRAMUSCULAR | Status: DC | PRN
  Administered 2017-11-06: 14 mL/h via EPIDURAL
  Filled 2017-11-06: qty 100

## 2017-11-06 MED ORDER — WITCH HAZEL-GLYCERIN EX PADS: 1.0000 "application " | MEDICATED_PAD | CUTANEOUS | Status: DC | PRN

## 2017-11-06 MED ORDER — TETANUS-DIPHTH-ACELL PERTUSSIS 5-2.5-18.5 LF-MCG/0.5 IM SUSP: 0.5000 mL | Freq: Once | INTRAMUSCULAR | Status: DC

## 2017-11-06 MED ORDER — PRENATAL MULTIVITAMIN CH
1.0000 | ORAL_TABLET | Freq: Every day | ORAL | Status: DC
  Administered 2017-11-06 – 2017-11-07 (×2): 1 via ORAL
  Filled 2017-11-06 (×2): qty 1

## 2017-11-06 MED ORDER — SIMETHICONE 80 MG PO CHEW: 80.0000 mg | CHEWABLE_TABLET | ORAL | Status: DC | PRN

## 2017-11-06 MED ORDER — PHENYLEPHRINE 40 MCG/ML (10ML) SYRINGE FOR IV PUSH (FOR BLOOD PRESSURE SUPPORT)
80.0000 ug | PREFILLED_SYRINGE | INTRAVENOUS | Status: DC | PRN
  Filled 2017-11-06: qty 5

## 2017-11-06 MED ORDER — PHENYLEPHRINE 40 MCG/ML (10ML) SYRINGE FOR IV PUSH (FOR BLOOD PRESSURE SUPPORT)
80.0000 ug | PREFILLED_SYRINGE | INTRAVENOUS | Status: DC | PRN
  Filled 2017-11-06: qty 5
  Filled 2017-11-06: qty 10

## 2017-11-06 MED ORDER — DIPHENHYDRAMINE HCL 25 MG PO CAPS: 25.0000 mg | ORAL_CAPSULE | Freq: Four times a day (QID) | ORAL | Status: DC | PRN

## 2017-11-06 MED ORDER — ONDANSETRON HCL 4 MG PO TABS: 4.0000 mg | ORAL_TABLET | ORAL | Status: DC | PRN

## 2017-11-06 MED ORDER — DIPHENHYDRAMINE HCL 50 MG/ML IJ SOLN: 12.5000 mg | INTRAMUSCULAR | Status: DC | PRN

## 2017-11-06 MED ORDER — ZOLPIDEM TARTRATE 5 MG PO TABS: 5.0000 mg | ORAL_TABLET | Freq: Every evening | ORAL | Status: DC | PRN

## 2017-11-06 MED ORDER — LACTATED RINGERS IV SOLN: 500.0000 mL | Freq: Once | INTRAVENOUS | Status: DC

## 2017-11-06 MED ORDER — DIBUCAINE 1 % RE OINT: 1.0000 "application " | TOPICAL_OINTMENT | RECTAL | Status: DC | PRN

## 2017-11-06 MED ORDER — LIDOCAINE HCL (PF) 1 % IJ SOLN
INTRAMUSCULAR | Status: DC | PRN
  Administered 2017-11-06 (×2): 5 mL via EPIDURAL

## 2017-11-06 NOTE — Progress Notes (Signed)
Patient ID: Janice Hines, female   DOB: [birth year], 37 y.o.   MRN: [id number] Doing well  SROM occurred after RN checked patient  Vitals:   11/06/17 0031 11/06/17 0101 11/06/17 0131 11/06/17 0201  BP: 101/62 104/64 106/68 102/65  Pulse: 75 82 78 79  Resp: Temp:      TempSrc:      SpO2:      Weight:      Height:       FHR reactive  Dilation: 7.5 Effacement (%): 90 Cervical Position: Middle Station: -2 Presentation: Vertex Exam by:: Pola Corn, RN  Anticipate SVD

## 2017-11-06 NOTE — Anesthesia Procedure Notes (Signed)
Epidural Patient location during procedure: OB Start time: 11/06/2017 3:01 AM End time: 11/06/2017 3:11 AM  Staffing Anesthesiologist: Achille Rich, MD Performed: anesthesiologist   Preanesthetic Checklist Completed: patient identified, site marked, pre-op evaluation, timeout performed, IV checked, risks and benefits discussed and monitors and equipment checked  Epidural Patient position: sitting Prep: DuraPrep Patient monitoring: heart rate, cardiac monitor, continuous pulse ox and blood pressure Approach: midline Location: L2-L3 Injection technique: LOR saline  Needle:  Needle type: Tuohy  Needle gauge: 17 G Needle length: 9 cm Needle insertion depth: 3 cm Catheter type: closed end flexible Catheter size: 19 Gauge Catheter at skin depth: 10 cm Test dose: negative and Other  Assessment Events: blood not aspirated, injection not painful, no injection resistance and negative IV test  Additional Notes Informed consent obtained prior to proceeding including risk of failure, 1% risk of PDPH, risk of minor discomfort and bruising.  Discussed rare but serious complications including epidural abscess, permanent nerve injury, epidural hematoma.  Discussed alternatives to epidural analgesia and patient desires to proceed.  Timeout performed pre-procedure verifying patient name, procedure, and platelet count.  Patient tolerated procedure well. Reason for block:procedure for pain

## 2017-11-06 NOTE — Anesthesia Postprocedure Evaluation (Signed)
Anesthesia Post Note  Patient: Janice Hines  Procedure(s) Performed: AN AD HOC LABOR EPIDURAL     Patient location during evaluation: Mother Baby Anesthesia Type: Epidural Level of consciousness: awake and alert Pain management: pain level controlled Vital Signs Assessment: post-procedure vital signs reviewed and stable Respiratory status: spontaneous breathing, nonlabored ventilation and respiratory function stable Cardiovascular status: stable Postop Assessment: no headache, no backache, epidural receding, patient able to bend at knees and able to ambulate Anesthetic complications: no    Last Vitals:  Vitals:   11/06/17 0520 11/06/17 0635  BP: 95/67 102/65  Pulse: 73 74  Resp: 20 18  Temp: 37.3 C 36.8 C  SpO2:      Last Pain:  Vitals:   11/06/17 0716  TempSrc:   PainSc: Asleep   Pain Goal:                 Laban Emperor

## 2017-11-06 NOTE — Lactation Note (Signed)
This note was copied from a baby's chart. Lactation Consultation Note  Patient Name: Janice Hines [id number] Date: 11/06/2017    [facility] Initial Visit: Mother is sleeping; will have dayshift LC made initial visit.    [name] 11/06/2017, 6:53 AM

## 2017-11-06 NOTE — Anesthesia Preprocedure Evaluation (Signed)
Anesthesia Evaluation  °Patient identified by MRN, date of birth, ID band °Patient awake ° ° ° °Reviewed: °Allergy & Precautions, H&P , NPO status , Patient's Chart, lab work & pertinent test results ° °Airway °Mallampati: II ° ° °Neck ROM: full ° ° ° Dental °  °Pulmonary °neg pulmonary ROS,  °  °breath sounds clear to auscultation ° ° ° ° ° ° Cardiovascular °negative cardio ROS ° ° °Rhythm:regular Rate:Normal ° ° °  °Neuro/Psych °  ° GI/Hepatic °  °Endo/Other  °diabetes, Type 2 ° Renal/GU °  ° °  °Musculoskeletal ° ° Abdominal °  °Peds ° Hematology °  °Anesthesia Other Findings ° ° Reproductive/Obstetrics °(+) Pregnancy ° °  ° ° ° ° ° ° ° ° ° ° ° ° ° °  °  ° ° ° ° ° ° ° ° °Anesthesia Physical °Anesthesia Plan ° °ASA: II ° °Anesthesia Plan: Epidural  ° °Post-op Pain Management:   ° °Induction: Intravenous ° °PONV Risk Score and Plan: 2 and Treatment may vary due to age or medical condition ° °Airway Management Planned: Natural Airway ° °Additional Equipment:  ° °Intra-op Plan:  ° °Post-operative Plan:  ° °Informed Consent: I have reviewed the patients History and Physical, chart, labs and discussed the procedure including the risks, benefits and alternatives for the proposed anesthesia with the patient or authorized representative who has indicated his/her understanding and acceptance.  ° ° ° ° ° °Plan Discussed with: Anesthesiologist ° °Anesthesia Plan Comments:   ° ° ° ° ° ° °Anesthesia Quick Evaluation ° °

## 2017-11-06 NOTE — Lactation Note (Signed)
This note was copied from a baby's chart. Lactation Consultation Note  Patient Name: Janice Hines ZOXWR'U Date: 11/06/2017 Reason for consult: Initial assessment   [employer] interpreter used for [place]mese.  Family member interpreting asleep along with baby. P4, Mother breastfed other children for approx 1.5 years each. Reviewed hand expression with great flow of colostrum. RN stated baby curls in bottom lip when feeding. Encouraged mother to flange bottom lip. Mother states she wants to breastfeed and formula feed. Encouraged mother that she it appears that she has good milk supply. Suggest she breastfeed before offering formula to help establish her milk supply. Mom encouraged to feed baby 8-12 times/24 hours and with feeding cues.  Mom made aware of our phone # for post-discharge questions.     Maternal Data Has patient been taught Hand Expression?: Yes  Feeding Feeding Type: Breast Fed Length of feed: 15 min  LATCH Score Latch: Grasps breast easily, tongue down, lips flanged, rhythmical sucking.  Audible Swallowing: A few with stimulation  Type of Nipple: Everted at rest and after stimulation  Comfort (Breast/Nipple): Soft / non-tender  Hold (Positioning): Assistance needed to correctly position infant at breast and maintain latch.  LATCH Score: 8  Interventions    Lactation Tools Discussed/Used     Consult Status Consult Status: Follow-up Date: 11/07/17 Follow-up type: In-patient    Dahlia Byes [facility] 11/06/2017, 10:16 AM

## 2017-11-07 MED ORDER — IBUPROFEN 600 MG PO TABS: 600.0000 mg | ORAL_TABLET | Freq: Four times a day (QID) | ORAL | 0 refills | Status: AC

## 2017-11-07 NOTE — Discharge Summary (Addendum)
OB Discharge Summary     Patient Name: Janice Hines DOB: [birth year] MRN: [id number]  Date of admission: 11/05/2017 Delivering MD: Vickki Muff T   Date of discharge: 11/07/2017  Admitting diagnosis: 38.2WKS BLEEDING,CTX Intrauterine pregnancy: [redacted]w[redacted]d    Secondary diagnosis:  Active Problems:   Normal labor   Gestational diabetes   SVD (spontaneous vaginal delivery)  Additional problems: none     Discharge diagnosis: Term Pregnancy Delivered                                                                                                Post partum procedures:none  Augmentation: N/A  Complications: None  Hospital course:  Onset of Labor With Vaginal Delivery     37y.o. yo [id number] [id number] admitted in Active Labor on 11/05/2017. Patient had an uncomplicated labor course as follows:  Membrane Rupture Time/Date: 2:08 AM ,11/06/2017   Intrapartum Procedures: Episiotomy: None [1]                                         Lacerations:  None [1]  Patient had a delivery of a Viable infant. 11/06/2017  Information for the patient's newborn:  WiLaverne, Hursey0[[id number]]Delivery Method: Vaginal, Spontaneous(Filed from Delivery Summary)    Pateint had an uncomplicated postpartum course.  She is ambulating, tolerating a regular diet, passing flatus, and urinating well. Patient is discharged home in stable condition on 11/07/17.   Physical exam  Vitals:   11/06/17 1200 11/06/17 1558 11/06/17 1749 11/07/17 0529  BP: 103/67 130/80 108/71 93/66  Pulse: 67 84 79 72  Resp: 18 20 18 16   Temp: 97.7 F (36.5 C) (!) 97.4 F (36.3 C) 97.8 F (36.6 C) 98.1 F (36.7 C)  TempSrc: Oral Oral Oral Oral  SpO2:      Weight:      Height:       General: alert, cooperative and no distress Lochia: appropriate Uterine Fundus: firm Incision: N/A DVT Evaluation: No evidence of DVT seen on physical exam. Labs: Lab Results  Component Value Date   WBC 11.1 (H) 11/05/2017   HGB 12.0 11/05/2017   HCT  35.8 (L) 11/05/2017   MCV 84.8 11/05/2017   PLT 189 11/05/2017   No flowsheet data found.  Discharge instruction: per After Visit Summary and "Baby and Me Booklet".  After visit meds:  Allergies as of 11/07/2017   No Known Allergies     Medication List    STOP taking these medications   ACCU-CHEK FASTCLIX LANCETS Misc   ACCU-CHEK GUIDE w/Device Kit   aspirin EC 81 MG tablet   glucose blood test strip Commonly known as:  ACCU-CHEK GUIDE   triamcinolone 0.025 % cream Commonly known as:  KENALOG     TAKE these medications   ibuprofen 600 MG tablet Commonly known as:  ADVIL,MOTRIN Take 1 tablet (600 mg total) by mouth every 6 (six) hours.   prenatal vitamin w/FE, FA 27-1 MG Tabs tablet Take 1  tablet by mouth daily at 12 noon.       Diet: routine diet  Activity: Advance as tolerated. Pelvic rest for 6 weeks.   Outpatient follow up:6 weeks Follow up Appt: Future Appointments  Date Time Provider [place]  12/07/2017 10:15 AM Sloan Leiter, MD [facility] None   Follow up Visit:No follow-ups on file.  Postpartum contraception: Nexplanon  Newborn Data: Live born female  Birth Weight: 7 lb 8.8 oz (3425 g) APGAR: 8, 9  Newborn Delivery   Birth date/time:  11/06/2017 03:38:00 Delivery type:  Vaginal, Spontaneous     Baby Feeding: Bottle and Breast Disposition:home with mother   11/07/2017 Vira Browns, MD, PGY-1 Family Medicine - [place]  I have seen and examined this patient and agree with above documentation in the resident's note.   PPV on 12/07/17 with 2-hr GTT  Gailen Shelter, MD OB Fellow 9:20 AM

## 2017-11-07 NOTE — Progress Notes (Signed)
All discharge teaching and instructions given to patient and After Visit Summary discussed and given to patient through [facility] [phone]. Pt is receptive.

## 2017-11-08 ENCOUNTER — Ambulatory Visit: Payer: Self-pay

## 2017-11-08 NOTE — Lactation Note (Signed)
This note was copied from a baby's chart. Lactation Consultation Note  Patient Name: Janice Hines [id number] Date: 11/08/2017 Reason for consult: Follow-up assessment;Infant weight loss;Term;Hyperbilirubinemia(4% weight loss , repeat Bili for this afternoon ) - [employer] Interpreter - [phone] Janice Hines  Baby is 59 hours old  LC reviewed and updated the doc flow sheets.  As LC entered the room, mom noted to have poor posterior breast feeding, and shallow latch . LC assisted mom with extra pillows and support and worked on depth at the breast.  Multiple swallows and the baby  was able to sustain latch for 17 mins , and released. Nipple  Well rounded and breast softened down. Per mom more comfortable.  Left breast full to areas of engorgement, LC provided a  Ice pack and instructed to ice for  15 - 20 mins and then release with hand expressing or pumping down with hand pump  LC had instructed mom on the use hand pump/ #24 F good fit/ and #27 F provided if needed at home.  Discussed with mom the importance of STS feedings until back up to birth weight and until the baby can  Stay awake for a feeding. Nutritive vs non - nutritive feeding cues and the importance of watching for hanging out  Latched.  Sore nipple and engorgement prevention and tx reviewed.  Mother informed of post-discharge support and given phone number to the lactation department, including services for phone call assistance; out-patient appointments; and breastfeeding support group. List of other breastfeeding resources in the community given in the handout. Encouraged mother to call for problems or concerns related to breastfeeding.      Maternal Data Has patient been taught Hand Expression?: Yes  Feeding Feeding Type: Breast Fed Length of feed: 17 min  LATCH Score Latch: Grasps breast easily, tongue down, lips flanged, rhythmical sucking.  Audible Swallowing: Spontaneous and intermittent  Type of Nipple: Everted at rest and  after stimulation  Comfort (Breast/Nipple): Filling, red/small blisters or bruises, mild/mod discomfort  Hold (Positioning): Assistance needed to correctly position infant at breast and maintain latch.  LATCH Score: 8  Interventions Interventions: Breast feeding basics reviewed;Assisted with latch;Skin to skin;Breast massage;Hand express;Breast compression;Adjust position;Support pillows;Position options;Hand pump  Lactation Tools Discussed/Used Tools: Pump;Flanges Flange Size: 24;27 Breast pump type: Manual WIC Program: Yes(per mom [place] )   Consult Status Consult Status: Complete Date: 11/08/17    Janice Hines 11/08/2017, 1:05 PM

## 2017-11-09 ENCOUNTER — Inpatient Hospital Stay (HOSPITAL_COMMUNITY)
Admission: RE | Admit: 2017-11-09 | Discharge: 2017-11-09 | Disposition: A | Discharge status: Home / Self Care | Payer: Medicaid Other | Source: Ambulatory Visit | Attending: Family Medicine | Admitting: Family Medicine

## 2017-11-09 ENCOUNTER — Ambulatory Visit: Payer: Self-pay

## 2017-11-09 NOTE — Lactation Note (Addendum)
This note was copied from a baby's chart. Lactation Consultation Note  Patient Name: Janice Hines ZOXWR'U Date: 11/09/2017 Reason for consult: Follow-up assessment;Engorgement   Follow up with Exp BF mom of 81 hour old infant. Infant was finishing a feeding when LC entered room. Mom was massaging breast with feeding. Spoke to family with assistance of Burmese interpreter [employer] # [id number]. Mom recently pumped with DEBP and obtained about 90 cc EBM. Breast milk storage reviewed with mom.   Enc mom to BF infant STS 8-12 x in 24 hours. Enc mom to soften areola as needed prior to latch with hand expression or manual pump. Enc mom to stimulate infant as needed for feeding and to massage/compress breast with feeding. Enc mom to pump breast post feeding as needed for comfort. Mom has manual pump for home use.   Reviewed I/O, signs of dehydration in the infant, engorgement care and breast milk expression and storage. Mom is to feed infant pumped EBM if needed if infant not willing to feed at the breast.   [facility] Brochure reviewed, mom to call with any questions/concerns as needed. Mom asked when she can be d/c. Assisted them with gathering their belongings. RN reports she will be in to do d/c teaching in about 10 minutes, parents voiced understanding.    Maternal Data Formula Feeding for Exclusion: No Has patient been taught Hand Expression?: Yes Does the patient have breastfeeding experience prior to this delivery?: Yes  Feeding    LATCH Score                   Interventions    Lactation Tools Discussed/Used WIC Program: Yes Pump Review: Setup, frequency, and cleaning;Milk Storage Initiated by:: Kristine Linea rn Date initiated:: 11/09/17   Consult Status Consult Status: Complete Follow-up type: Call as needed    Janice Hines 11/09/2017, 1:00 PM

## 2017-12-07 ENCOUNTER — Ambulatory Visit (INDEPENDENT_AMBULATORY_CARE_PROVIDER_SITE_OTHER): Payer: Medicaid Other | Admitting: Obstetrics and Gynecology

## 2017-12-07 ENCOUNTER — Encounter: Payer: Self-pay | Admitting: Obstetrics and Gynecology

## 2017-12-07 DIAGNOSIS — O24419 Gestational diabetes mellitus in pregnancy, unspecified control: Secondary | ICD-10-CM

## 2017-12-07 DIAGNOSIS — Z30430 Encounter for insertion of intrauterine contraceptive device: Secondary | ICD-10-CM | POA: Diagnosis not present

## 2017-12-07 DIAGNOSIS — Z01812 Encounter for preprocedural laboratory examination: Secondary | ICD-10-CM

## 2017-12-07 DIAGNOSIS — Z3202 Encounter for pregnancy test, result negative: Secondary | ICD-10-CM

## 2017-12-07 LAB — POCT URINE PREGNANCY: PREG TEST UR: NEGATIVE

## 2017-12-07 MED ORDER — LEVONORGESTREL 19.5 MCG/DAY IU IUD
INTRAUTERINE_SYSTEM | Freq: Once | INTRAUTERINE | Status: AC
  Administered 2017-12-07: 1 via INTRAUTERINE

## 2017-12-07 NOTE — Progress Notes (Signed)
    Obstetrics/Postpartum Visit  Appointment Date: 12/07/2017  [facility] [place]  Primary Care Provider: Patient, No Pcp Per  Chief Complaint:  Chief Complaint  Patient presents with  . Postpartum Care    History of Present Illness: Janice Hines is a 37 y.o. Asian [id number] (No LMP recorded.), seen for the above chief complaint. Her past medical history is significant for gestational diabetes.  She is s/p SVD on 11/06/17 at 39.2 weeks; she was discharged to home on PPD#2. Pregnancy complicated by gestational diabetes.  Complains of nothing  Vaginal bleeding or discharge: No  Breast or formula feeding: breast Intercourse: No  Contraception: for IUD today PP depression s/s: No  Any bowel or bladder issues: No  Pap smear: no abnormalities (date: 05/2015)  Review of Systems: Positive for nothing.   Her 12 point review of systems is negative or as noted in the History of Present Illness.  Patient Active Problem List   Diagnosis Date Noted  . SVD (spontaneous vaginal delivery) 11/06/2017  . Normal labor 11/05/2017  . Gestational diabetes 11/05/2017  . Rash 10/02/2017  . Gestational diabetes mellitus (GDM), antepartum 05/11/2017  . AMA (advanced maternal age) multigravida 35+, unspecified trimester 05/11/2017  . Supervision of high risk pregnancy, antepartum 05/03/2017  . Language barrier 05/03/2017  . H/O macrosomia in infant in prior pregnancy, currently pregnant 05/03/2017    Medications We administered Levonorgestrel. Current Outpatient Medications  Medication Sig Dispense Refill  . ibuprofen (ADVIL,MOTRIN) 600 MG tablet Take 1 tablet (600 mg total) by mouth every 6 (six) hours. 30 tablet 0  . prenatal vitamin w/FE, FA (PRENATAL 1 + 1) 27-1 MG TABS tablet Take 1 tablet by mouth daily at 12 noon. 30 each 11   No current facility-administered medications for this visit.     Allergies Patient has no known allergies.  Physical Exam:  There were no vitals taken  for this visit. There is no height or weight on file to calculate BMI. General appearance: Well nourished, well developed female in no acute distress.  Cardiovascular: regular rate and rhythm Respiratory:  Normal respiratory effort Abdomen: no masses, hernias; diffusely non tender to palpation, non distended Breasts: not examined. Neuro/Psych:  Normal mood and affect.  Skin:  Warm and dry.   Pelvic exam: is not limited by body habitus EGBUS: within normal limits Vagina: within normal limits and with None blood in the vault, Cervix:  no lesions or cervical motion tenderness Uterus:  nonenlarged and approximately 10 week sized Adnexa:  not evaluated Rectovaginal: n/a  PP Depression Screening:  negative  Assessment: Patient is a 37 y.o. A5W0981G4P4004 who is 4 weeks post partum from a SVD. She is doing very well. Desires IUD placement today which was done, please see procedure note for details.  Plan:   1. Status post delivery at term Benign exam, no issues  2. Encounter for IUD insertion Please see procedure note for details Liletta IUD inserted without issue  3. Pre-procedure lab exam - POCT urine pregnancy  4. Gestational diabetes mellitus (GDM), antepartum, gestational diabetes method of control unspecified Return for 2 hr GTT   RTC 4 weeks for string check, 2 hr GTT,  6 months for annual or prn   K. Therese SarahMeryl Davis, M.D. Attending Obstetrician & Gynecologist, [facility] for [facility], [facility]

## 2017-12-07 NOTE — Progress Notes (Signed)
    IUD INSERTION PROCEDURE NOTE  Janice Hines is a 37 y.o. [id number] here for Liletta insertion. No GYN concerns.   She was counseled regarding the risks/benefits of IUD including insertion risk of infection, hemorrhage, damage to surrounding tissue and organs, uterine perforation. She was counseled regarding risks of IUD including implantation into uterine wall, migration outside of uterus, possible need for hysteroscopic or laparoscopic removal, ovarian cysts, expulsion. She was advised that risk of pregnancy is low with negative UPT but is not zero and IUD insertion may cause miscarriage. Reviewed that she is also at slightly higher risk for ectopic pregnancy and she should take a pregnancy test if she believes she may be pregnant. She was advised to use backup method of protection for one week. She verbalized understanding of all of the above and consent signed.   Has not been sexually active since delivery Last pap smear was on 05/2015 and was negative UPT today: negative  IUD Insertion  Patient identified and an adequate time out was performed. Speculum placed in the vagina. The cervix was cleaned with Betadine x 2 and grasped anteriorly with a single tooth tenaculum.  A uterine sound was used to sound the uterus to 9 cm;  the IUD was then placed per manufacturer's recommendations. Strings trimmed to 3 cm. Tenaculum was removed, good hemostasis noted at site with minimal pressure. Patient tolerated procedure well.   Patient was given post-procedure instructions.  She was reminded to have backup contraception for one week during this transition period between IUDs.  Patient was also asked to check IUD strings periodically and follow up in 4 weeks for IUD check.  Liletta IUD Exp: 11/2020 Lot: [id number]  K. Therese SarahMeryl Serafin [place], M.D. Attending Obstetrician & Gynecologist, [facility], [facility]

## 2017-12-11 ENCOUNTER — Encounter: Payer: Self-pay | Admitting: Advanced Practice Midwife

## 2017-12-11 ENCOUNTER — Ambulatory Visit (INDEPENDENT_AMBULATORY_CARE_PROVIDER_SITE_OTHER): Payer: Medicaid Other | Admitting: Advanced Practice Midwife

## 2017-12-11 VITALS — BP 137/74 | HR 59 | Temp 98.4°F | Ht 62.0 in | Wt 143.0 lb

## 2017-12-11 DIAGNOSIS — T8332XA Displacement of intrauterine contraceptive device, initial encounter: Secondary | ICD-10-CM

## 2017-12-11 DIAGNOSIS — N719 Inflammatory disease of uterus, unspecified: Secondary | ICD-10-CM | POA: Diagnosis not present

## 2017-12-11 DIAGNOSIS — R509 Fever, unspecified: Secondary | ICD-10-CM | POA: Diagnosis not present

## 2017-12-11 DIAGNOSIS — Z3042 Encounter for surveillance of injectable contraceptive: Secondary | ICD-10-CM | POA: Diagnosis not present

## 2017-12-11 MED ORDER — MEDROXYPROGESTERONE ACETATE 150 MG/ML IM SUSP
150.0000 mg | Freq: Once | INTRAMUSCULAR | Status: AC
  Administered 2017-12-11: 150 mg via INTRAMUSCULAR

## 2017-12-11 MED ORDER — CEPHALEXIN 500 MG PO CAPS: 500.0000 mg | ORAL_CAPSULE | Freq: Four times a day (QID) | ORAL | 0 refills | Status: DC

## 2017-12-11 NOTE — Progress Notes (Signed)
depo I Pad Burmese interpreter used for visit. Having lots of cramping since placement on Friday. Pain increased last nigh and feeling like she has a low grade fever. Patient has IUD with her wrapped in [place] - she passed it yesterday.    Armandina StammerJennifer Lurlean Kernen RN

## 2017-12-12 ENCOUNTER — Encounter: Payer: Self-pay | Admitting: Advanced Practice Midwife

## 2017-12-12 DIAGNOSIS — T8332XA Displacement of intrauterine contraceptive device, initial encounter: Secondary | ICD-10-CM | POA: Insufficient documentation

## 2017-12-12 NOTE — Patient Instructions (Signed)

## 2017-12-12 NOTE — Progress Notes (Signed)
   Subjective:    Patient ID: Janice Hines, female    DOB: [birth year], 37 y.o.   MRN: [id number]  Presents for "IUD check".  Brought IUD in wrapped in tissue.  Larey SeatFell out yesterday.  Has some pelvic tenderness and fever.  No bleeding. IUD placed on 12/07/17 Interpretor used  Fever   This is a new problem. The current episode started yesterday. The problem occurs intermittently. The maximum temperature noted was 99 to 99.9 F. Associated symptoms include abdominal pain (tenderness, mild). Pertinent negatives include no headaches, muscle aches, nausea, urinary pain or vomiting. She has tried nothing for the symptoms.    Review of Systems  Constitutional: Positive for chills and fever.  Gastrointestinal: Positive for abdominal pain (tenderness, mild). Negative for nausea and vomiting.  Genitourinary: Positive for pelvic pain. Negative for dysuria, vaginal bleeding and vaginal pain.  Neurological: Negative for headaches.       Objective:   Physical Exam  Constitutional: She is oriented to person, place, and time. She appears well-developed and well-nourished. No distress.  HENT:  Head: Normocephalic.  Cardiovascular: Normal rate.  Pulmonary/Chest: Effort normal.  Abdominal: Soft. She exhibits no distension and no mass. There is tenderness (slightly tender over pelvis). There is no rebound and no guarding.  Genitourinary: Vagina normal.  Musculoskeletal: She exhibits no edema.  Neurological: She is alert and oriented to person, place, and time.  Skin: Skin is warm and dry.          Assessment & Plan:  Discussed expulsion Suspect she may have a mild endometritis, possibly contributing to cramping and expulsion Due to mild fever and tenderness will Rx Keflex for treatment of possible endometritis Depo Provera today per pt request Wants Nexplanon in a few weeks

## 2018-01-07 ENCOUNTER — Ambulatory Visit: Payer: Medicaid Other | Admitting: Obstetrics & Gynecology

## 2018-01-21 ENCOUNTER — Encounter: Payer: Self-pay | Admitting: Obstetrics & Gynecology

## 2018-01-21 ENCOUNTER — Ambulatory Visit (INDEPENDENT_AMBULATORY_CARE_PROVIDER_SITE_OTHER): Payer: Medicaid Other | Admitting: Obstetrics & Gynecology

## 2018-01-21 VITALS — BP 111/73 | HR 72 | Ht 62.0 in | Wt 144.0 lb

## 2018-01-21 DIAGNOSIS — Z3202 Encounter for pregnancy test, result negative: Secondary | ICD-10-CM | POA: Diagnosis not present

## 2018-01-21 DIAGNOSIS — Z01812 Encounter for preprocedural laboratory examination: Secondary | ICD-10-CM

## 2018-01-21 DIAGNOSIS — Z30017 Encounter for initial prescription of implantable subdermal contraceptive: Secondary | ICD-10-CM

## 2018-01-21 DIAGNOSIS — Z3046 Encounter for surveillance of implantable subdermal contraceptive: Secondary | ICD-10-CM | POA: Diagnosis not present

## 2018-01-21 LAB — POCT URINE PREGNANCY: PREG TEST UR: NEGATIVE

## 2018-01-21 NOTE — Patient Instructions (Signed)
Nexplanon Instructions After Insertion   Keep bandage clean and dry for 24 hours   May use ice/Tylenol/Ibuprofen for soreness or pain   If you develop fever, drainage or increased warmth from incision site-contact office immediately  Etonogestrel implant What is this medicine? ETONOGESTREL (et oh noe JES trel) is a contraceptive (birth control) device. It is used to prevent pregnancy. It can be used for up to 3 years. This medicine may be used for other purposes; ask your health care provider or pharmacist if you have questions. COMMON BRAND NAME(S): Implanon, Nexplanon What should I tell my health care provider before I take this medicine? They need to know if you have any of these conditions: -abnormal vaginal bleeding -blood vessel disease or blood clots -cancer of the breast, cervix, or liver -depression -diabetes -gallbladder disease -headaches -heart disease or recent heart attack -high blood pressure -high cholesterol -kidney disease -liver disease -renal disease -seizures -tobacco smoker -an unusual or allergic reaction to etonogestrel, other hormones, anesthetics or antiseptics, medicines, foods, dyes, or preservatives -pregnant or trying to get pregnant -breast-feeding How should I use this medicine? This device is inserted just under the skin on the inner side of your upper arm by a health care professional. Talk to your pediatrician regarding the use of this medicine in children. Special care may be needed. Overdosage: If you think you have taken too much of this medicine contact a poison control center or emergency room at once. NOTE: This medicine is only for you. Do not share this medicine with others. What if I miss a dose? This does not apply. What may interact with this medicine? Do not take this medicine with any of the following medications: -amprenavir -bosentan -fosamprenavir This medicine may also interact with the following  medications: -barbiturate medicines for inducing sleep or treating seizures -certain medicines for fungal infections like ketoconazole and itraconazole -grapefruit juice -griseofulvin -medicines to treat seizures like carbamazepine, felbamate, oxcarbazepine, phenytoin, topiramate -modafinil -phenylbutazone -rifampin -rufinamide -some medicines to treat HIV infection like atazanavir, indinavir, lopinavir, nelfinavir, tipranavir, ritonavir -St. John's wort This list may not describe all possible interactions. Give your health care provider a list of all the medicines, herbs, non-prescription drugs, or dietary supplements you use. Also tell them if you smoke, drink alcohol, or use illegal drugs. Some items may interact with your medicine. What should I watch for while using this medicine? This product does not protect you against HIV infection (AIDS) or other sexually transmitted diseases. You should be able to feel the implant by pressing your fingertips over the skin where it was inserted. Contact your doctor if you cannot feel the implant, and use a non-hormonal birth control method (such as condoms) until your doctor confirms that the implant is in place. If you feel that the implant may have broken or become bent while in your arm, contact your healthcare provider. What side effects may I notice from receiving this medicine? Side effects that you should report to your doctor or health care professional as soon as possible: -allergic reactions like skin rash, itching or hives, swelling of the face, lips, or tongue -breast lumps -changes in emotions or moods -depressed mood -heavy or prolonged menstrual bleeding -pain, irritation, swelling, or bruising at the insertion site -scar at site of insertion -signs of infection at the insertion site such as fever, and skin redness, pain or discharge -signs of pregnancy -signs and symptoms of a blood clot such as breathing problems; changes in  vision; chest pain; severe,   sudden headache; pain, swelling, warmth in the leg; trouble speaking; sudden numbness or weakness of the face, arm or leg -signs and symptoms of liver injury like dark yellow or brown urine; general ill feeling or flu-like symptoms; light-colored stools; loss of appetite; nausea; right upper belly pain; unusually weak or tired; yellowing of the eyes or skin -unusual vaginal bleeding, discharge -signs and symptoms of a stroke like changes in vision; confusion; trouble speaking or understanding; severe headaches; sudden numbness or weakness of the face, arm or leg; trouble walking; dizziness; loss of balance or coordination Side effects that usually do not require medical attention (report to your doctor or health care professional if they continue or are bothersome): -acne -back pain -breast pain -changes in weight -dizziness -general ill feeling or flu-like symptoms -headache -irregular menstrual bleeding -nausea -sore throat -vaginal irritation or inflammation This list may not describe all possible side effects. Call your doctor for medical advice about side effects. You may report side effects to FDA at 1-800-FDA-1088. Where should I keep my medicine? This drug is given in a hospital or clinic and will not be stored at home. NOTE: This sheet is a summary. It may not cover all possible information. If you have questions about this medicine, talk to your doctor, pharmacist, or health care provider.  2018 Elsevier/Gold Standard (2015-12-30 11:19:22)  

## 2018-01-21 NOTE — Progress Notes (Signed)
Pt has LnIUD placed 12/07/2017 but, it was expelled. Pt is worried that it will fall out again and her insurance will run out. She is interested in a Nexplanon.   Patient given informed consent, she signed consent form. Pregnancy test was negative.  Appropriate time out taken.  Patient's left arm was prepped and draped in the usual sterile fashion.. The ruler used to measure and mark insertion area.  Patient was prepped with alcohol swab and then injected with 5 ml of 1 % lidocaine.  She was prepped with betadine, Nexplanon removed from packaging,  Device confirmed in needle, then inserted full length of needle and withdrawn per handbook instructions.  There was minimal blood loss.  Patient insertion site covered with guaze and a pressure bandage to reduce any bruising.  The patient tolerated the procedure well and was given post procedure instructions. Return in about one month for Nexplanon check.  Deral Schellenberg L. Harraway-Smith, M.D., [facility] Core[facility]ACOG

## 2018-01-21 NOTE — Progress Notes (Signed)
Patient presents for nexplanon insertion. Had failed IUD insertion.   Patient plans to return this week for 2 hr PP. Armandina StammerJennifer Howard RN

## 2018-01-22 ENCOUNTER — Ambulatory Visit: Payer: Medicaid Other

## 2018-01-22 ENCOUNTER — Ambulatory Visit: Payer: Medicaid Other | Admitting: Advanced Practice Midwife

## 2018-01-22 DIAGNOSIS — O99810 Abnormal glucose complicating pregnancy: Secondary | ICD-10-CM

## 2018-01-22 NOTE — Progress Notes (Signed)
Patient presents for 2 hour glucose testing postpartum. Sent to lab. Armandina StammerJennifer Howard RN

## 2018-01-23 LAB — GLUCOSE TOLERANCE, 2 HOURS
Glucose, 2 hour: 59 mg/dL — ABNORMAL LOW (ref 65–139)
Glucose, GTT - Fasting: 78 mg/dL (ref 65–99)

## 2018-01-23 MED ORDER — ETONOGESTREL 68 MG ~~LOC~~ IMPL
68.0000 mg | DRUG_IMPLANT | Freq: Once | SUBCUTANEOUS | Status: AC
  Administered 2018-01-23: 68 mg via SUBCUTANEOUS

## 2018-01-23 NOTE — Addendum Note (Signed)
Addended by: Anell BarrHOWARD, Renzo Vincelette L on: 01/23/2018 01:19 PM   Modules accepted: Orders

## 2018-01-28 ENCOUNTER — Ambulatory Visit: Payer: Medicaid Other | Admitting: Obstetrics & Gynecology

## 2018-01-31 ENCOUNTER — Ambulatory Visit (INDEPENDENT_AMBULATORY_CARE_PROVIDER_SITE_OTHER): Payer: Medicaid Other | Admitting: Family Medicine

## 2018-01-31 VITALS — BP 132/86 | Temp 98.6°F | Ht 62.0 in | Wt 144.0 lb

## 2018-01-31 DIAGNOSIS — L03114 Cellulitis of left upper limb: Secondary | ICD-10-CM | POA: Diagnosis not present

## 2018-01-31 DIAGNOSIS — Z3046 Encounter for surveillance of implantable subdermal contraceptive: Secondary | ICD-10-CM | POA: Diagnosis not present

## 2018-01-31 MED ORDER — SULFAMETHOXAZOLE-TRIMETHOPRIM 800-160 MG PO TABS: 1.0000 | ORAL_TABLET | Freq: Two times a day (BID) | ORAL | 0 refills | Status: DC

## 2018-01-31 MED FILL — SULFAMETHOXAZOLE-TMP DS TAB: 800-160 | 7 days supply | Qty: 14 | Fill #0

## 2018-01-31 NOTE — Addendum Note (Signed)
Addended by: Anell BarrHOWARD, Batu Cassin L on: 01/31/2018 11:33 AM   Modules accepted: Orders

## 2018-01-31 NOTE — Progress Notes (Signed)
Patient [facility] for swelling and redness around the site of her Nexplanon. [facility] Patient was instructed to follow up with our office.Nexplanon was placed in our office on 01/21/18 Armandina StammerJennifer Branson Kranz RN

## 2018-01-31 NOTE — Progress Notes (Signed)
   Subjective:    Patient ID: Janice Hines, female    DOB: [birth year], 37 y.o.   MRN: [id number]  HPI Patient seen for infection around nexplanon. Area red, swollen, tender. Started on Monday. Someone at [facility] saw her and prescribed keflex. Has been on it for two days. Got a little better initially, but now a little worse. No purulent drainage.   Review of Systems     Objective:   Physical Exam  Constitutional: She appears well-developed and well-nourished.  HENT:  Head: Normocephalic and atraumatic.  Skin:          Assessment & Plan:  1. Cellulitis of left upper extremity Change to bactrim x7 days. Follow up in 1 week. If not improved, may consider hypersensitivity reaction to the device or to the barium in it.

## 2018-02-04 ENCOUNTER — Encounter: Payer: Self-pay | Admitting: Obstetrics & Gynecology

## 2018-02-04 ENCOUNTER — Ambulatory Visit (INDEPENDENT_AMBULATORY_CARE_PROVIDER_SITE_OTHER): Payer: Medicaid Other | Admitting: Obstetrics & Gynecology

## 2018-02-04 VITALS — BP 115/73 | HR 83 | Ht 64.0 in | Wt 139.0 lb

## 2018-02-04 DIAGNOSIS — T8189XA Other complications of procedures, not elsewhere classified, initial encounter: Principal | ICD-10-CM

## 2018-02-04 DIAGNOSIS — R55 Syncope and collapse: Secondary | ICD-10-CM

## 2018-02-04 DIAGNOSIS — Z3046 Encounter for surveillance of implantable subdermal contraceptive: Secondary | ICD-10-CM | POA: Diagnosis not present

## 2018-02-04 NOTE — Progress Notes (Signed)
History:  37 y.o. [id number] here today for f/u of infected Nexplanon site. Pt was seen in the ofc on 01/21/2018 and had a Nexplanon placed without difficulty.  [facility] days prev and was dx'd within infection and was placed on Keflex. She reports that her arm looks and feels much better. She denies fever or chills.    The following portions of the patient's history were reviewed and updated as appropriate: allergies, current medications, past family history, past medical history, past social history, past surgical history and problem list.  Review of Systems:  Pertinent items are noted in HPI.    Objective:  Physical Exam Blood pressure 115/73, pulse 83, height 5\' 4"  (1.626 m), weight 139 lb (63 kg), not currently breastfeeding.  CONSTITUTIONAL: Well-developed, well-nourished female in no acute distress.  HENT:  Normocephalic, atraumatic EYES: Conjunctivae and EOM are normal. No scleral icterus.  NECK: Normal range of motion SKIN: Skin is warm and dry. No rash noted. Not diaphoretic.No pallor. NEUROLGIC: Alert and oriented to person, place, and time. Normal coordination.  Left arm: the Nexplanon site is indurated without fluctuance no warmth is noted. It is slightly erythematous.     Labs and Imaging No results found.  Assessment & Plan:  Infected Nexplanon site. Improved on atbx  Pt requests removal (this was requested at the end of the visit)  Requests depo provera for contraception   F/u in 1 week for eval and removal   Wilson Sample L. Harraway-Smith, M.D., [facility]OG

## 2018-02-14 ENCOUNTER — Encounter: Payer: Self-pay | Admitting: Obstetrics & Gynecology

## 2018-02-14 ENCOUNTER — Ambulatory Visit (INDEPENDENT_AMBULATORY_CARE_PROVIDER_SITE_OTHER): Payer: Medicaid Other | Admitting: Obstetrics & Gynecology

## 2018-02-14 VITALS — BP 117/74 | HR 92 | Wt 129.0 lb

## 2018-02-14 DIAGNOSIS — Z3046 Encounter for surveillance of implantable subdermal contraceptive: Secondary | ICD-10-CM | POA: Diagnosis not present

## 2018-02-14 DIAGNOSIS — Z3042 Encounter for surveillance of injectable contraceptive: Secondary | ICD-10-CM

## 2018-02-14 DIAGNOSIS — Z30013 Encounter for initial prescription of injectable contraceptive: Secondary | ICD-10-CM

## 2018-02-14 MED ORDER — MEDROXYPROGESTERONE ACETATE 150 MG/ML IM SUSP
150.0000 mg | Freq: Once | INTRAMUSCULAR | Status: AC
  Administered 2018-02-14: 150 mg via INTRAMUSCULAR

## 2018-02-14 NOTE — Patient Instructions (Signed)

## 2018-02-14 NOTE — Progress Notes (Signed)
Pt is s/p an allergic reaction the the Nexplanon. She was initially seen in the ED and dx'd with an infection but, the skin over the Nexplanon reveals induration and now superficial skin sloughing. She wants to begin Depo Provera.  Patient given informed consent, she signed consent form. She has gained >40# since her Nexplanon was placed.  She wants it removed.  Appropriate time out taken.  Patient's left arm was prepped and draped in the usual sterile fashion. Patient was prepped with alcohol swab and then injected with 3 ml of 1 % lidocaine.  She was prepped with betadine.  A #11 blade was used to make a small incision over the Nexplanon rod.  Using curved forceps, the end of the rod was grasped and the scar tissue was removed and the device was removed intact.  There was minimal blood loss. The incision site was covered with guaze and a pressure bandage to reduce any bruising.  The patient tolerated the procedure well and was given post procedure instructions.  Kaoir Loree L. Harraway-Smith, M.D., [facility]OG

## 2018-02-14 NOTE — Progress Notes (Signed)
Patient requesting removal of Nexplanon. Janice StammerJennifer Stellar Gensel RN

## 2018-02-15 ENCOUNTER — Encounter: Payer: Self-pay | Admitting: Obstetrics & Gynecology

## 2018-02-28 ENCOUNTER — Ambulatory Visit (INDEPENDENT_AMBULATORY_CARE_PROVIDER_SITE_OTHER): Payer: Medicaid Other | Admitting: Obstetrics & Gynecology

## 2018-02-28 ENCOUNTER — Encounter: Payer: Self-pay | Admitting: Obstetrics & Gynecology

## 2018-02-28 DIAGNOSIS — Z3046 Encounter for surveillance of implantable subdermal contraceptive: Secondary | ICD-10-CM | POA: Diagnosis not present

## 2018-02-28 NOTE — Progress Notes (Signed)
  Subjective:   cc: f/u after nexplanon removal 8/22 with cellulitis  Patient ID: Janice Hines, female   DOB: [birth year], 37 y.o.   MRN: [id number]  [id number] No LMP recorded. Nexplanon was removed 2 weeks ago due to infection. Her sx are improving. Depo Provera was given and she will return for repeat in 10 weeks Allergies  Allergen Reactions  . Nexplanon [Etonogestrel] Swelling    Pt had erythema and induration with skin sloughing after device placed.     Past Medical History:  Diagnosis Date  . Diabetes mellitus without complication (HCC)   . Gestational diabetes    Past Surgical History:  Procedure Laterality Date  . NO PAST SURGERIES      Review of Systems  Constitutional: Negative.   Gastrointestinal: Negative.   Genitourinary: Negative.   Musculoskeletal: Negative.        Objective:   Physical Exam  Constitutional: She is oriented to person, place, and time. She appears well-developed and well-nourished.  Pulmonary/Chest: Effort normal.  Musculoskeletal:  Minimal induration at site of removal of Nexplanon left arm, no tenderness or redness, drainage  Neurological: She is alert and oriented to person, place, and time.  Skin: Skin is warm and dry.  Vitals reviewed.      Assessment:     Doing well after nexplanon was removed Depo Provera for contraception    Plan:     RTC 10-12 weeks for DMPA  Adam Phenix, MD 02/28/2018

## 2018-02-28 NOTE — Patient Instructions (Signed)

## 2018-05-06 ENCOUNTER — Ambulatory Visit (INDEPENDENT_AMBULATORY_CARE_PROVIDER_SITE_OTHER): Payer: Medicaid Other

## 2018-05-06 VITALS — BP 109/72 | HR 72

## 2018-05-06 DIAGNOSIS — Z3042 Encounter for surveillance of injectable contraceptive: Secondary | ICD-10-CM | POA: Diagnosis not present

## 2018-05-06 DIAGNOSIS — Z23 Encounter for immunization: Secondary | ICD-10-CM

## 2018-05-06 MED ORDER — MEDROXYPROGESTERONE ACETATE 150 MG/ML IM SUSP
150.0000 mg | Freq: Once | INTRAMUSCULAR | Status: AC
  Administered 2018-05-06: 150 mg via INTRAMUSCULAR

## 2018-05-06 NOTE — Progress Notes (Signed)
Chart reviewed - agree with CMA documentation.   

## 2018-05-06 NOTE — Progress Notes (Signed)
Janice Hines here for Depo-Provera  Injection.  Injection administered without complication. Patient will return in 3 months for next injection.   l , CMA 05/06/2018  10:45 AM

## 2018-07-22 ENCOUNTER — Ambulatory Visit (INDEPENDENT_AMBULATORY_CARE_PROVIDER_SITE_OTHER): Payer: Medicaid Other | Admitting: Obstetrics & Gynecology

## 2018-07-22 VITALS — BP 108/70 | HR 88 | Wt 124.0 lb

## 2018-07-22 DIAGNOSIS — N644 Mastodynia: Secondary | ICD-10-CM

## 2018-07-22 DIAGNOSIS — Z3042 Encounter for surveillance of injectable contraceptive: Secondary | ICD-10-CM

## 2018-07-22 MED ORDER — MEDROXYPROGESTERONE ACETATE 150 MG/ML IM SUSP
150.0000 mg | Freq: Once | INTRAMUSCULAR | Status: AC
  Administered 2018-07-22: 150 mg via INTRAMUSCULAR

## 2018-07-22 NOTE — Progress Notes (Signed)
Patient complaining of left breast pain. Patient states she is still breast feeding and her baby bit her. Patient is unable to wear a bra.   Patient also in office for next Depo Provera injection.Dr. Erin Fulling at bedside to evaluate breast soreness Armandina Stammer RN

## 2018-07-22 NOTE — Progress Notes (Signed)
History:  38 y.o. [id number] here today for c/o nippkle pain after baby bit her nipple. She then put a needle in the nipple to 'open the hole' where she thought baby bit.  The following portions of the patient's history were reviewed and updated as appropriate: allergies, current medications, past family history, past medical history, past social history, past surgical history and problem list.  Review of Systems:  Pertinent items are noted in HPI.    Objective:  Physical Exam Blood pressure 108/70, pulse 88, weight 124 lb (56.2 kg), not currently breastfeeding.  CONSTITUTIONAL: Well-developed, well-nourished female in no acute distress.  HENT:  Normocephalic, atraumatic EYES: Conjunctivae and EOM are normal. No scleral icterus.  NECK: Normal range of motion SKIN: Skin is warm and dry. No rash noted. Not diaphoretic.No pallor. NEUROLGIC: Alert and oriented to person, place, and time. Normal coordination.  Breast: no skin changes. bilateral expression of milk. No skin breakdown.   Assessment & Plan:  Nipple trauma with break milk let down   rec keep nipple clean and dry and stop picking at it and pulling of the scab and sticking  with pins.   Total face-to-face time with patient was 10 min.  Greater than 50% was spent in counseling and coordination of care with the patient.   Rockford Leinen L. Harraway-Smith, M.D., [facility]n Core

## 2018-07-23 ENCOUNTER — Encounter: Payer: Self-pay | Admitting: Obstetrics & Gynecology

## 2018-07-23 NOTE — Addendum Note (Signed)
Addended by: Willodean Rosenthal on: 07/23/2018 03:06 PM   Modules accepted: Level of Service

## 2018-10-14 ENCOUNTER — Ambulatory Visit: Payer: Medicaid Other

## 2018-10-16 ENCOUNTER — Other Ambulatory Visit: Payer: Self-pay

## 2018-10-16 ENCOUNTER — Ambulatory Visit (INDEPENDENT_AMBULATORY_CARE_PROVIDER_SITE_OTHER): Payer: Medicaid Other

## 2018-10-16 VITALS — BP 115/73 | HR 82 | Ht 60.0 in | Wt 133.0 lb

## 2018-10-16 DIAGNOSIS — Z3042 Encounter for surveillance of injectable contraceptive: Secondary | ICD-10-CM | POA: Diagnosis not present

## 2018-10-16 MED ORDER — MEDROXYPROGESTERONE ACETATE 150 MG/ML IM SUSP
150.0000 mg | Freq: Once | INTRAMUSCULAR | Status: AC
  Administered 2018-10-16: 10:00:00 150 mg via INTRAMUSCULAR

## 2018-10-16 MED ORDER — MEDROXYPROGESTERONE ACETATE 150 MG/ML IM SUSP: 150.0000 mg | INTRAMUSCULAR | 3 refills | Status: DC

## 2018-10-16 NOTE — Progress Notes (Addendum)
Nealy Heiney here for Depo-Provera  Injection.  Injection administered without complication. Patient will return in 3 months for next injection.  chiquita l wilson, CMA 10/16/2018  10:16 AM  Attestation of Attending Supervision of RN: Evaluation and management procedures were performed by the nurse under my supervision and collaboration.  I have reviewed the nursing note and chart, and I agree with the management and plan.  Carolyn L. Harraway-Smith, M.D., [facility] Core

## 2019-01-13 ENCOUNTER — Other Ambulatory Visit: Payer: Self-pay

## 2019-01-13 ENCOUNTER — Ambulatory Visit (INDEPENDENT_AMBULATORY_CARE_PROVIDER_SITE_OTHER): Payer: Medicaid Other

## 2019-01-13 VITALS — BP 115/77 | HR 75 | Ht 60.0 in | Wt 137.0 lb

## 2019-01-13 DIAGNOSIS — Z3042 Encounter for surveillance of injectable contraceptive: Secondary | ICD-10-CM | POA: Diagnosis not present

## 2019-01-13 MED ORDER — MEDROXYPROGESTERONE ACETATE 150 MG/ML IM SUSP: 150.0000 mg | INTRAMUSCULAR | 3 refills | Status: DC

## 2019-01-13 MED ORDER — MEDROXYPROGESTERONE ACETATE 150 MG/ML IM SUSP
150.0000 mg | Freq: Once | INTRAMUSCULAR | Status: AC
  Administered 2019-01-13: 150 mg via INTRAMUSCULAR

## 2019-01-13 MED FILL — medroxyPROGESTERone ACETATE: 150 | 90 days supply | Qty: 1 | Fill #0

## 2019-01-13 NOTE — Progress Notes (Addendum)
Janice Hines here for Depo-Provera  Injection.  Injection administered without complication. Patient will return in 3 months for next injection.  Nancie Bocanegra l Thornton Dohrmann, CMA 01/13/2019  2:47 PM  Attestation of Attending Supervision of RN: Evaluation and management procedures were performed by the nurse under my supervision and collaboration.  I have reviewed the nursing note and chart, and I agree with the management and plan.  Carolyn L. Harraway-Smith, M.D., Cherlynn June

## 2019-04-10 ENCOUNTER — Other Ambulatory Visit: Payer: Self-pay

## 2019-04-10 ENCOUNTER — Ambulatory Visit (INDEPENDENT_AMBULATORY_CARE_PROVIDER_SITE_OTHER): Payer: Medicaid Other

## 2019-04-10 ENCOUNTER — Ambulatory Visit: Payer: Medicaid Other

## 2019-04-10 VITALS — BP 106/78 | HR 68 | Ht 60.0 in | Wt 128.0 lb

## 2019-04-10 DIAGNOSIS — Z3042 Encounter for surveillance of injectable contraceptive: Secondary | ICD-10-CM | POA: Diagnosis not present

## 2019-04-10 DIAGNOSIS — Z23 Encounter for immunization: Secondary | ICD-10-CM | POA: Diagnosis not present

## 2019-04-10 MED ORDER — MEDROXYPROGESTERONE ACETATE 150 MG/ML IM SUSP
150.0000 mg | Freq: Once | INTRAMUSCULAR | Status: AC
  Administered 2019-04-10: 150 mg via INTRAMUSCULAR

## 2019-04-10 NOTE — Progress Notes (Signed)
Chart reviewed - agree with CMA documentation.   

## 2019-04-10 NOTE — Progress Notes (Signed)
Janice Hines here for Depo-Provera  Injection.  Injection administered without complication. Patient will return in 3 months for next injection. Flu vaccine was also administered at this visit.   Telissa Palmisano l Abdikadir Fohl, CMA 04/10/2019  10:07 AM

## 2019-06-30 ENCOUNTER — Other Ambulatory Visit: Payer: Self-pay

## 2019-06-30 ENCOUNTER — Ambulatory Visit (INDEPENDENT_AMBULATORY_CARE_PROVIDER_SITE_OTHER): Payer: Medicaid Other

## 2019-06-30 DIAGNOSIS — Z3042 Encounter for surveillance of injectable contraceptive: Secondary | ICD-10-CM | POA: Diagnosis not present

## 2019-06-30 MED ORDER — MEDROXYPROGESTERONE ACETATE 150 MG/ML IM SUSP
150.0000 mg | Freq: Once | INTRAMUSCULAR | Status: AC
  Administered 2019-06-30: 150 mg via INTRAMUSCULAR

## 2019-06-30 MED FILL — medroxyPROGESTERone ACETATE: 150 | 90 days supply | Qty: 1 | Fill #1

## 2019-06-30 NOTE — Progress Notes (Addendum)
Patient present for Depo provera injection. Patient is within her time frame. Patient presents with her depo provera supplied by her pharmacy. Armandina Stammer RN  Attestation of Attending Supervision of RN: Evaluation and management procedures were performed by the nurse under my supervision and collaboration.  I have reviewed the nursing note and chart, and I agree with the management and plan.  Carolyn L. Harraway-Smith, M.D., [facility] Core

## 2019-09-22 ENCOUNTER — Ambulatory Visit (INDEPENDENT_AMBULATORY_CARE_PROVIDER_SITE_OTHER): Payer: Medicaid Other

## 2019-09-22 ENCOUNTER — Other Ambulatory Visit: Payer: Self-pay

## 2019-09-22 VITALS — BP 99/60 | HR 67 | Ht 60.0 in | Wt 126.1 lb

## 2019-09-22 DIAGNOSIS — Z3042 Encounter for surveillance of injectable contraceptive: Secondary | ICD-10-CM | POA: Diagnosis not present

## 2019-09-22 MED ORDER — MEDROXYPROGESTERONE ACETATE 150 MG/ML IM SUSP
150.0000 mg | Freq: Once | INTRAMUSCULAR | Status: AC
  Administered 2019-09-22: 150 mg via INTRAMUSCULAR

## 2019-09-22 MED FILL — MEDROXYPROGESTERONE ACETATE: 150 | 90 days supply | Qty: 1 | Fill #2

## 2019-09-22 NOTE — Progress Notes (Addendum)
Ashaki Forsberg here for Depo-Provera  Injection.  Injection administered without complication. Patient will return in 3 months for next injection.  Jazalyn Mondor l Terance Pomplun, CMA 09/22/2019  10:53 AM   Attestation of Attending Supervision of CMA/RN: Evaluation and management procedures were performed by the nurse under my supervision and collaboration.  I have reviewed the nursing note and chart, and I agree with the management and plan.  Carolyn L. Harraway-Smith, M.D., [facility]n Core

## 2019-12-08 ENCOUNTER — Other Ambulatory Visit: Payer: Self-pay

## 2019-12-08 ENCOUNTER — Ambulatory Visit (INDEPENDENT_AMBULATORY_CARE_PROVIDER_SITE_OTHER): Payer: Medicaid Other

## 2019-12-08 VITALS — BP 115/75 | HR 64 | Wt 126.0 lb

## 2019-12-08 DIAGNOSIS — Z3042 Encounter for surveillance of injectable contraceptive: Secondary | ICD-10-CM | POA: Diagnosis not present

## 2019-12-08 MED ORDER — MEDROXYPROGESTERONE ACETATE 150 MG/ML IM SUSP
150.0000 mg | Freq: Once | INTRAMUSCULAR | Status: AC
  Administered 2019-12-08: 150 mg via INTRAMUSCULAR

## 2019-12-08 MED FILL — MEDROXYPROGESTERONE ACETATE: 150 | 90 days supply | Qty: 1 | Fill #3

## 2019-12-08 NOTE — Progress Notes (Addendum)
Janice Hines here for Depo-Provera  Injection.  Injection administered without complication. Patient will return in 3 months for next injection.  Janice Hines l Janice Hines, CMA 12/08/2019  9:56 AM   Attestation of Attending Supervision of CMA: Evaluation and management procedures were performed by the nurse under my supervision and collaboration.  I have reviewed the nursing note and chart, and I agree with the management and plan.  Carolyn L. Harraway-Smith, M.D., [facility] Core

## 2020-02-23 ENCOUNTER — Ambulatory Visit (INDEPENDENT_AMBULATORY_CARE_PROVIDER_SITE_OTHER): Payer: BLUE CROSS/BLUE SHIELD

## 2020-02-23 ENCOUNTER — Other Ambulatory Visit: Payer: Self-pay

## 2020-02-23 VITALS — BP 110/70 | HR 88 | Wt 126.0 lb

## 2020-02-23 DIAGNOSIS — Z3042 Encounter for surveillance of injectable contraceptive: Secondary | ICD-10-CM

## 2020-02-23 MED ORDER — MEDROXYPROGESTERONE ACETATE 150 MG/ML IM SUSP
150.0000 mg | Freq: Once | INTRAMUSCULAR | Status: AC
  Administered 2020-02-23: 150 mg via INTRAMUSCULAR

## 2020-02-23 MED ORDER — MEDROXYPROGESTERONE ACETATE 150 MG/ML IM SUSP: 150.0000 mg | INTRAMUSCULAR | 3 refills | Status: DC

## 2020-02-23 MED FILL — MEDROXYPROGESTERONE ACETATE: 150 | 90 days supply | Qty: 1 | Fill #0

## 2020-02-23 NOTE — Progress Notes (Signed)
Patient presents for Depo Provera,  Patient supplied depo provera. Patient made aware to return in three months. Armandina Stammer RN

## 2020-02-23 NOTE — Progress Notes (Signed)
Chart reviewed - agree with CMA/RN documentation.  ° °

## 2020-04-23 ENCOUNTER — Ambulatory Visit: Payer: BLUE CROSS/BLUE SHIELD | Admitting: Obstetrics & Gynecology

## 2020-05-17 ENCOUNTER — Other Ambulatory Visit: Payer: Self-pay

## 2020-05-17 ENCOUNTER — Ambulatory Visit (INDEPENDENT_AMBULATORY_CARE_PROVIDER_SITE_OTHER): Payer: BLUE CROSS/BLUE SHIELD

## 2020-05-17 VITALS — BP 126/78 | HR 79 | Ht 60.0 in | Wt 127.0 lb

## 2020-05-17 DIAGNOSIS — Z3042 Encounter for surveillance of injectable contraceptive: Secondary | ICD-10-CM | POA: Diagnosis not present

## 2020-05-17 DIAGNOSIS — Z23 Encounter for immunization: Secondary | ICD-10-CM | POA: Diagnosis not present

## 2020-05-17 MED ORDER — MEDROXYPROGESTERONE ACETATE 150 MG/ML IM SUSP
150.0000 mg | Freq: Once | INTRAMUSCULAR | Status: AC
  Administered 2020-05-17: 150 mg via INTRAMUSCULAR

## 2020-05-17 MED FILL — MEDROXYPROGESTERONE ACETATE: 150 | 90 days supply | Qty: 1 | Fill #1

## 2020-05-17 NOTE — Progress Notes (Signed)
Patient presents for Depo Provera injection. Patient scheduled for annual exam. Patient also requests her flu shot at this time.  Darl Pikes- [phone] interpreter used for this visit today. Armandina Stammer RN

## 2020-05-17 NOTE — Progress Notes (Signed)
I have reviewed this chart and agree with the RN/CMA assessment and management.    K. Meryl Casey Fye, M.D. Attending [facility])   

## 2020-06-14 ENCOUNTER — Encounter: Payer: Self-pay | Admitting: Obstetrics & Gynecology

## 2020-06-14 ENCOUNTER — Other Ambulatory Visit: Payer: Self-pay

## 2020-06-14 ENCOUNTER — Ambulatory Visit (INDEPENDENT_AMBULATORY_CARE_PROVIDER_SITE_OTHER): Payer: Medicaid Other | Admitting: Obstetrics & Gynecology

## 2020-06-14 ENCOUNTER — Inpatient Hospital Stay (HOSPITAL_COMMUNITY): Admit: 2020-06-14 | Payer: BLUE CROSS/BLUE SHIELD

## 2020-06-14 VITALS — BP 105/73 | HR 73 | Wt 123.0 lb

## 2020-06-14 DIAGNOSIS — Z01419 Encounter for gynecological examination (general) (routine) without abnormal findings: Secondary | ICD-10-CM

## 2020-06-14 NOTE — Progress Notes (Signed)
Burmese interpreter # [id number]. Armandina Stammer RN   Last depo 05-17-2020.

## 2020-06-14 NOTE — Progress Notes (Signed)
Subjective:     Janice Hines is a 39 y.o. female here for a routine exam.  Current complaints: none. Married and monogamous. Pt reports no menses since she's been on the Depo Provera.       Gynecologic History No LMP recorded. Patient has had an injection. Contraception: Depo-Provera injections Last Pap: no prior results seen in record. Normal per pt  Last mammogram: n/a  Obstetric History OB History  Gravida Para Term Preterm AB Living  4 4 4     4   SAB IAB Ectopic Multiple Live Births        0 4    # Outcome Date GA Lbr Len/2nd Weight Sex Delivery Anes PTL Lv  4 Term 11/06/17 [redacted]w[redacted]d 13:25 / 00:13 7 lb 8.8 oz (3.425 kg) M Vag-Spont EPI  LIV  3 Term 06/20/13   9 lb 8 oz (4.309 kg)  Vag-Spont   LIV  2 Term 06/11/09   8 lb 11 oz (3.941 kg) M Vag-Spont     1 Term 03/31/04 [redacted]w[redacted]d  8 lb 3 oz (3.714 kg) M    LIV     The following portions of the patient's history were reviewed and updated as appropriate: allergies, current medications, past family history, past medical history, past social history, past surgical history and problem list.  Review of Systems Pertinent items are noted in HPI.    Objective:  BP 105/73   Pulse 73   Wt 123 lb (55.8 kg)   BMI 24.02 kg/m  General Appearance:    Alert, cooperative, no distress, appears stated age  Head:    Normocephalic, without obvious abnormality, atraumatic  Eyes:    conjunctiva/corneas clear, EOM's intact, both eyes  Ears:    Normal external ear canals, both ears  Nose:   Nares normal, septum midline, mucosa normal, no drainage    or sinus tenderness  Throat:   Lips, mucosa, and tongue normal; teeth and gums normal  Neck:   Supple, symmetrical, trachea midline, no adenopathy;    thyroid:  no enlargement/tenderness/nodules  Back:     Symmetric, no curvature, ROM normal, no CVA tenderness  Lungs:     respirations unlabored  Chest Wall:    No tenderness or deformity   Heart:    Regular rate and rhythm  Breast Exam:    No tenderness,  masses, or nipple abnormality  Abdomen:     Soft, non-tender, bowel sounds active all four quadrants,    no masses, no organomegaly  Genitalia:    Normal female without lesion, discharge or tenderness     Extremities:   Extremities normal, atraumatic, no cyanosis or edema  Pulses:   2+ and symmetric all extremities  Skin:   Skin color, texture, turgor normal, no rashes or lesions    Assessment:    Healthy female exam.   Contraception counseling   Plan:  Janice Hines was seen today for gynecologic exam.  Diagnoses and all orders for this visit:  Well female exam with routine gynecological exam  keep Depo Provera F/u in 1 year or sooner prn  Janice Hines L. Harraway-Smith, M.D., [facility]

## 2020-06-15 LAB — CYTOLOGY - PAP
Comment: NEGATIVE
Diagnosis: NEGATIVE
High risk HPV: NEGATIVE

## 2020-08-09 ENCOUNTER — Ambulatory Visit (INDEPENDENT_AMBULATORY_CARE_PROVIDER_SITE_OTHER): Payer: Medicaid Other

## 2020-08-09 ENCOUNTER — Other Ambulatory Visit: Payer: Self-pay

## 2020-08-09 VITALS — BP 117/73 | HR 66

## 2020-08-09 DIAGNOSIS — Z3042 Encounter for surveillance of injectable contraceptive: Secondary | ICD-10-CM | POA: Diagnosis not present

## 2020-08-09 MED ORDER — MEDROXYPROGESTERONE ACETATE 150 MG/ML IM SUSP
150.0000 mg | INTRAMUSCULAR | Status: DC
  Administered 2020-08-09 – 2021-01-10 (×2): 150 mg via INTRAMUSCULAR

## 2020-08-09 MED FILL — medroxyPROGESTERone ACETATE: 150 | 90 days supply | Qty: 1 | Fill #2

## 2020-08-09 NOTE — Progress Notes (Signed)
Patient presents for Depo Provera injection.  Patient asked about pap smear results and normal pap smear results given. Armandina Stammer RN

## 2020-10-25 ENCOUNTER — Other Ambulatory Visit (HOSPITAL_BASED_OUTPATIENT_CLINIC_OR_DEPARTMENT_OTHER): Payer: Self-pay

## 2020-10-25 ENCOUNTER — Ambulatory Visit (INDEPENDENT_AMBULATORY_CARE_PROVIDER_SITE_OTHER): Payer: Medicaid Other

## 2020-10-25 ENCOUNTER — Other Ambulatory Visit: Payer: Self-pay

## 2020-10-25 DIAGNOSIS — Z3042 Encounter for surveillance of injectable contraceptive: Secondary | ICD-10-CM

## 2020-10-25 MED FILL — Medroxyprogesterone Acetate IM Susp Prefilled Syr 150 MG/ML: INTRAMUSCULAR | 84 days supply | Qty: 1 | Fill #0 | Status: AC

## 2020-10-25 NOTE — Progress Notes (Signed)
Patient presents for Depo Provera injection. Patient provided Depo provera. Patient schedule follow up depo provera appointment in three months. Armandina Stammer RN

## 2021-01-10 ENCOUNTER — Other Ambulatory Visit: Payer: Self-pay

## 2021-01-10 ENCOUNTER — Other Ambulatory Visit (HOSPITAL_BASED_OUTPATIENT_CLINIC_OR_DEPARTMENT_OTHER): Payer: Self-pay

## 2021-01-10 ENCOUNTER — Ambulatory Visit (INDEPENDENT_AMBULATORY_CARE_PROVIDER_SITE_OTHER): Payer: Medicaid Other

## 2021-01-10 VITALS — BP 107/73 | HR 70 | Wt 121.0 lb

## 2021-01-10 DIAGNOSIS — Z3042 Encounter for surveillance of injectable contraceptive: Secondary | ICD-10-CM | POA: Diagnosis not present

## 2021-01-10 NOTE — Progress Notes (Signed)
Patient was assessed and managed by nursing staff during this encounter. I have reviewed the chart and agree with the documentation and plan.   Aneesh Faller, MD 01/10/2021 10:15 AM 

## 2021-01-10 NOTE — Progress Notes (Signed)
Janice Hines here for Depo-Provera  Injection.  Injection administered without complication. Patient will return in 3 months for next injection.  Janice Hines l Janice Hines, CMA 01/10/2021  8:33 AM

## 2021-03-28 ENCOUNTER — Other Ambulatory Visit: Payer: Self-pay | Admitting: Family Medicine

## 2021-03-28 ENCOUNTER — Other Ambulatory Visit: Payer: Self-pay

## 2021-03-28 ENCOUNTER — Ambulatory Visit (INDEPENDENT_AMBULATORY_CARE_PROVIDER_SITE_OTHER): Payer: 59

## 2021-03-28 ENCOUNTER — Other Ambulatory Visit (HOSPITAL_BASED_OUTPATIENT_CLINIC_OR_DEPARTMENT_OTHER): Payer: Self-pay

## 2021-03-28 DIAGNOSIS — Z3042 Encounter for surveillance of injectable contraceptive: Secondary | ICD-10-CM | POA: Diagnosis not present

## 2021-03-28 MED ORDER — MEDROXYPROGESTERONE ACETATE 150 MG/ML IM SUSP
150.0000 mg | Freq: Once | INTRAMUSCULAR | Status: AC
  Administered 2021-03-28: 150 mg via INTRAMUSCULAR

## 2021-03-28 MED ORDER — MEDROXYPROGESTERONE ACETATE 150 MG/ML IM SUSY
150.0000 mg | PREFILLED_SYRINGE | INTRAMUSCULAR | 0 refills | Status: DC
  Filled 2021-03-28: qty 1, 90d supply, fill #0

## 2021-03-28 NOTE — Addendum Note (Signed)
Addended by: Mikey Bussing on: 03/28/2021 08:52 AM   Modules accepted: Orders

## 2021-03-28 NOTE — Progress Notes (Signed)
REFILL ON DEPO-PROVERA

## 2021-03-28 NOTE — Progress Notes (Addendum)
Janice Hines here for Depo-Provera  Injection.  Injection administered without complication. Patient will return in 3 months for next injection.  [employer] Language interpreter [place] [phone].  Dash Cardarelli l Kalub Morillo, CMA 03/28/2021  8:27 AM  Attestation of Attending Supervision of RN: Evaluation and management procedures were performed by the nurse under my supervision and collaboration.  I have reviewed the nursing note and chart, and I agree with the management and plan.  Carolyn L. Harraway-Smith, M.D., [facility]

## 2021-03-29 ENCOUNTER — Encounter: Payer: Self-pay | Admitting: Advanced Practice Midwife

## 2021-03-29 ENCOUNTER — Ambulatory Visit (INDEPENDENT_AMBULATORY_CARE_PROVIDER_SITE_OTHER): Payer: 59 | Admitting: Advanced Practice Midwife

## 2021-03-29 ENCOUNTER — Other Ambulatory Visit (HOSPITAL_BASED_OUTPATIENT_CLINIC_OR_DEPARTMENT_OTHER): Payer: Self-pay

## 2021-03-29 VITALS — BP 123/82 | HR 92 | Wt 120.0 lb

## 2021-03-29 DIAGNOSIS — K649 Unspecified hemorrhoids: Secondary | ICD-10-CM

## 2021-03-29 DIAGNOSIS — R1030 Lower abdominal pain, unspecified: Secondary | ICD-10-CM | POA: Diagnosis not present

## 2021-03-29 DIAGNOSIS — K5900 Constipation, unspecified: Secondary | ICD-10-CM | POA: Diagnosis not present

## 2021-03-29 LAB — POCT URINALYSIS DIPSTICK
Bilirubin, UA: NEGATIVE
Blood, UA: NEGATIVE
Glucose, UA: NEGATIVE
Ketones, UA: NEGATIVE
Leukocytes, UA: NEGATIVE
Nitrite, UA: NEGATIVE
Protein, UA: NEGATIVE
Spec Grav, UA: 1.01 (ref 1.010–1.025)
Urobilinogen, UA: 0.2 E.U./dL
pH, UA: 5 (ref 5.0–8.0)

## 2021-03-29 MED ORDER — POLYETHYLENE GLYCOL 3350 17 GM/SCOOP PO POWD
17.0000 g | Freq: Every day | ORAL | 0 refills | Status: AC
  Filled 2021-03-29: qty 14, 14d supply, fill #0

## 2021-03-29 MED ORDER — PROCTOFOAM HC 1-1 % EX FOAM
1.0000 | Freq: Two times a day (BID) | CUTANEOUS | 2 refills | Status: AC
  Filled 2021-03-29: qty 10, 19d supply, fill #0

## 2021-03-29 MED ORDER — DOCUSATE SODIUM 100 MG PO CAPS
100.0000 mg | ORAL_CAPSULE | Freq: Every day | ORAL | 2 refills | Status: AC
  Filled 2021-03-29: qty 30, 30d supply, fill #0

## 2021-03-29 NOTE — Progress Notes (Signed)
Patient complaining of abdominal pain for two weeks at beginning of Sept. Patient had bleeding for that two weeks. Patient hasn't not had bleeding while on Depo Provera. Armandina Stammer RN   [place] [phone] interpreter.

## 2021-03-29 NOTE — Progress Notes (Signed)
   Subjective:    Patient ID: Janice Hines, female    DOB: July 21, 1980, 40 y.o.   MRN: [id number] This is a 40 y.o. female who presents with c/o 2 week episode of cramping and spotting which has since resolved. She is not concerned about that now.  She c/o pain in anus, attributes it to hemorrhoids.  States has constipation, has Bm every 3-7 days.  Feels a lump after BM and when sitting.  Other This is a new problem. The current episode started 1 to 4 weeks ago. The problem has been unchanged. Pertinent negatives include no abdominal pain, chills, fatigue, fever or myalgias. Exacerbated by: BM and sitting. She has tried nothing for the symptoms.   Using DepoProvera for contraception  Review of Systems  Constitutional:  Negative for chills, fatigue and fever.  Gastrointestinal:  Negative for abdominal pain.  Musculoskeletal:  Negative for myalgias.      Objective:   Physical Exam Constitutional:      General: She is not in acute distress.    Appearance: She is not ill-appearing.  HENT:     Head: Normocephalic.  Cardiovascular:     Rate and Rhythm: Normal rate.  Pulmonary:     Effort: Pulmonary effort is normal.  Abdominal:     General: There is no distension.     Tenderness: There is no abdominal tenderness. There is no guarding.  Genitourinary:    General: Normal vulva.     Rectum: Normal.     Comments: Bimanual normal   Small uterus, nontender, no masses  External anal exam: no hemorrhoid seen Internal rectal exam:  no hemorrhoid palpated.  Pt states it comes out and then goes back in.  No bleeding.   No fissures Musculoskeletal:     Cervical back: Normal range of motion.  Skin:    General: Skin is warm and dry.  Neurological:     General: No focal deficit present.     Mental Status: She is alert.  Psychiatric:        Mood and Affect: Mood normal.          Assessment & Plan:  A:   Breakthrough bleeding on DepoProvera       Episode of cramping, likely associated with  bleeding        Hemorrhoid pain  P    discussed using ibuprofen for breakthrough bleeding       Rx Colace daily        Rx Miralax prn constipation       Rx Procotofoam HC, but insurance does not cover.  Advised using OTC preparations      Followup as needed

## 2021-06-07 ENCOUNTER — Other Ambulatory Visit (HOSPITAL_BASED_OUTPATIENT_CLINIC_OR_DEPARTMENT_OTHER): Payer: Self-pay

## 2021-06-27 ENCOUNTER — Ambulatory Visit: Payer: Medicaid Other

## 2021-06-27 ENCOUNTER — Other Ambulatory Visit: Payer: Self-pay | Admitting: Obstetrics & Gynecology

## 2021-06-27 ENCOUNTER — Other Ambulatory Visit (HOSPITAL_BASED_OUTPATIENT_CLINIC_OR_DEPARTMENT_OTHER): Payer: Self-pay

## 2021-06-28 ENCOUNTER — Other Ambulatory Visit (HOSPITAL_BASED_OUTPATIENT_CLINIC_OR_DEPARTMENT_OTHER): Payer: Self-pay

## 2021-06-28 ENCOUNTER — Other Ambulatory Visit: Payer: Self-pay

## 2021-06-28 ENCOUNTER — Ambulatory Visit (INDEPENDENT_AMBULATORY_CARE_PROVIDER_SITE_OTHER): Payer: Medicaid Other

## 2021-06-28 VITALS — BP 112/76 | HR 90 | Wt 124.0 lb

## 2021-06-28 DIAGNOSIS — Z3042 Encounter for surveillance of injectable contraceptive: Secondary | ICD-10-CM | POA: Diagnosis not present

## 2021-06-28 MED ORDER — MEDROXYPROGESTERONE ACETATE 150 MG/ML IM SUSY
150.0000 mg | PREFILLED_SYRINGE | INTRAMUSCULAR | 0 refills | Status: DC
  Filled 2021-06-28: qty 1, 84d supply, fill #0

## 2021-06-28 MED ORDER — MEDROXYPROGESTERONE ACETATE 150 MG/ML IM SUSP
150.0000 mg | Freq: Once | INTRAMUSCULAR | Status: AC
  Administered 2021-06-28: 150 mg via INTRAMUSCULAR

## 2021-06-28 NOTE — Progress Notes (Signed)
Date last pap: 06-14-2020. Last Depo-Provera: 03-29-21. Patient complaining of fatigue- patient would like to have annual exam. To schedule at check out.  Depo-Provera 150 mg IM given by: Armandina Stammer RN . Next appointment due March 21- April 4.   Armandina Stammer RN

## 2021-07-04 ENCOUNTER — Other Ambulatory Visit (HOSPITAL_BASED_OUTPATIENT_CLINIC_OR_DEPARTMENT_OTHER): Payer: Self-pay

## 2021-07-25 ENCOUNTER — Ambulatory Visit (INDEPENDENT_AMBULATORY_CARE_PROVIDER_SITE_OTHER): Payer: Medicaid Other | Admitting: Obstetrics and Gynecology

## 2021-07-25 ENCOUNTER — Other Ambulatory Visit: Payer: Self-pay

## 2021-07-25 ENCOUNTER — Encounter: Payer: Self-pay | Admitting: Obstetrics and Gynecology

## 2021-07-25 VITALS — BP 111/80 | HR 81 | Wt 123.0 lb

## 2021-07-25 DIAGNOSIS — K219 Gastro-esophageal reflux disease without esophagitis: Secondary | ICD-10-CM

## 2021-07-25 DIAGNOSIS — Z3042 Encounter for surveillance of injectable contraceptive: Secondary | ICD-10-CM

## 2021-07-25 DIAGNOSIS — Z3009 Encounter for other general counseling and advice on contraception: Secondary | ICD-10-CM | POA: Diagnosis not present

## 2021-07-25 DIAGNOSIS — Z01419 Encounter for gynecological examination (general) (routine) without abnormal findings: Secondary | ICD-10-CM | POA: Diagnosis not present

## 2021-07-25 NOTE — Progress Notes (Signed)
Video interpreting services used for visit. Interpreter [phone]. Patient would like hgbA1c checked and complaining of reflux symptoms. Armandina Stammer RN

## 2021-07-25 NOTE — Progress Notes (Signed)
GYNECOLOGY ANNUAL PREVENTATIVE CARE ENCOUNTER NOTE  History:     Janice Hines is a 41 y.o. [id number] female here for a routine annual gynecologic exam.  Current complaints: reflux since delivery of her child 3 years ago. She notes she eats a lot of spicy food and that triggers it. She takes omeprazole and that helps.   Denies abnormal vaginal bleeding, discharge, pelvic pain, problems with intercourse or other gynecologic concerns.     Gynecologic History No LMP recorded. Patient has had an injection. Contraception: Depo-Provera injections Last Pap: 05/2020. Result was normal with normal HPV Last Mammogram: not yet had  Obstetric History OB History  Gravida Para Term Preterm AB Living  4 4 4     4   SAB IAB Ectopic Multiple Live Births        0 4    # Outcome Date GA Lbr Len/2nd Weight Sex Delivery Anes PTL Lv  4 Term 11/06/17 [redacted]w[redacted]d 13:25 / 00:13 7 lb 8.8 oz (3.425 kg) M Vag-Spont EPI  LIV  3 Term 06/20/13   9 lb 8 oz (4.309 kg)  Vag-Spont   LIV  2 Term 06/11/09   8 lb 11 oz (3.941 kg) M Vag-Spont     1 Term 03/31/04 [redacted]w[redacted]d  8 lb 3 oz (3.714 kg) M    LIV    Past Medical History:  Diagnosis Date   Diabetes mellitus without complication (HCC)    Gestational diabetes     Past Surgical History:  Procedure Laterality Date   NO PAST SURGERIES      Current Outpatient Medications on File Prior to Visit  Medication Sig Dispense Refill   medroxyPROGESTERone Acetate 150 MG/ML SUSY Inject 1 mL (150 mg total) into the muscle every 3 (three) months. 1 mL 0   medroxyPROGESTERone Acetate 150 MG/ML SUSY Inject 1 mL (150 mg total) into the muscle every 3 (three) months. 1 mL 0   omeprazole (PRILOSEC) 20 MG capsule Take 20 mg by mouth daily.     docusate sodium (COLACE) 100 MG capsule Take 1 capsule (100 mg total) by mouth daily. (Patient not taking: Reported on 07/25/2021) 100 capsule 2   hydrocortisone-pramoxine (PROCTOFOAM HC) rectal foam Place 1 applicator rectally 2 (two) times daily.  (Patient not taking: Reported on 07/25/2021) 10 g 2   ibuprofen (ADVIL,MOTRIN) 600 MG tablet Take 1 tablet (600 mg total) by mouth every 6 (six) hours. (Patient not taking: No sig reported) 30 tablet 0   polyethylene glycol powder (KLS LAXACLEAR) 17 GM/SCOOP powder Take 17 g by mouth daily. (Patient not taking: Reported on 07/25/2021) 510 g 0   prenatal vitamin w/FE, FA (PRENATAL 1 + 1) 27-1 MG TABS tablet Take 1 tablet by mouth daily at 12 noon. 30 each 11   No current facility-administered medications on file prior to visit.    Allergies  Allergen Reactions   Nexplanon [Etonogestrel] Swelling    Pt had erythema and induration with skin sloughing after device placed.      Social History:  reports that she has never smoked. She has never used smokeless tobacco. She reports that she does not drink alcohol and does not use drugs.  Family History  Problem Relation Age of Onset   Diabetes Neg Hx    Hypertension Neg Hx     The following portions of the patient's history were reviewed and updated as appropriate: allergies, current medications, past family history, past medical history, past social history, past surgical history and problem list.  Review of Systems Pertinent items noted in HPI and remainder of comprehensive ROS otherwise negative.  Physical Exam:  BP 111/80    Pulse 81    Wt 123 lb (55.8 kg)    BMI 24.02 kg/m  CONSTITUTIONAL: Well-developed, well-nourished female in no acute distress.  HENT:  Normocephalic, atraumatic, External right and left ear normal.  EYES: Conjunctivae and EOM are normal. Pupils are equal, round, and reactive to light. No scleral icterus.  NECK: Normal range of motion, supple, no masses.  Normal thyroid.  SKIN: Skin is warm and dry. No rash noted. Not diaphoretic. No erythema. No pallor. MUSCULOSKELETAL: Normal range of motion. No tenderness.  No cyanosis, clubbing, or edema. NEUROLOGIC: Alert and oriented to person, place, and time. Normal reflexes,  muscle tone coordination.  PSYCHIATRIC: Normal mood and affect. Normal behavior. Normal judgment and thought content.  CARDIOVASCULAR: Normal heart rate noted, regular rhythm RESPIRATORY: Clear to auscultation bilaterally. Effort and breath sounds normal, no problems with respiration noted.  BREASTS: Symmetric in size. No masses, tenderness, skin changes, nipple drainage, or lymphadenopathy bilaterally. Performed in the presence of a chaperone. ABDOMEN: Soft, no distention noted.  No tenderness, rebound or guarding.  PELVIC: External genitalia normal, Vagina normal without discharge, Urethra without abnormality or discharge, no bladder tenderness, cervix normal in appearance, no CMT, uterus normal size, shape, and consistency, no adnexal masses or tenderness. Performed in the presence of a chaperone.  Assessment and Plan:    1. Encounter for annual routine gynecological examination - Cervical cancer screening: Discussed guidelines. Pap with HPV normal 05/2020 - Gardasil:  Has not yet had - GC/CT: declines - Birth Control: Depo provera - she would like to continue this.  - Breast Health: Encouraged self breast awareness/SBE. Teaching provided.  - F/U 12 months and prn  - HgB A1c per pt request  2. Gastroesophageal reflux disease, unspecified whether esophagitis present - She will continue omeprazole which helps. She will contact us or PCP if it stops working.   3. Contraceptive counseling - She would like to continue Depo provera  Routine preventative health maintenance measures emphasized. Please refer to After Visit Summary for other counseling recommendations.   Milas Hock, MD, FACOG Obstetrician & Gynecologist, [facility], [facility]

## 2021-07-26 LAB — HEMOGLOBIN A1C
Est. average glucose Bld gHb Est-mCnc: 105 mg/dL
Hgb A1c MFr Bld: 5.3 % (ref 4.8–5.6)

## 2021-07-27 ENCOUNTER — Telehealth: Payer: Self-pay

## 2021-07-27 NOTE — Telephone Encounter (Signed)
-----   Message from Radene Gunning, MD sent at 07/27/2021  2:37 PM EST ----- Please let her know A1C is normal at 5.3  Thanks, pad

## 2021-07-27 NOTE — Telephone Encounter (Signed)
Left message for patient to return call for results. Armandina Stammer RN

## 2021-09-19 ENCOUNTER — Other Ambulatory Visit: Payer: Self-pay

## 2021-09-19 ENCOUNTER — Other Ambulatory Visit: Payer: Self-pay | Admitting: Advanced Practice Midwife

## 2021-09-19 ENCOUNTER — Other Ambulatory Visit (HOSPITAL_BASED_OUTPATIENT_CLINIC_OR_DEPARTMENT_OTHER): Payer: Self-pay

## 2021-09-19 ENCOUNTER — Ambulatory Visit (INDEPENDENT_AMBULATORY_CARE_PROVIDER_SITE_OTHER): Payer: BLUE CROSS/BLUE SHIELD

## 2021-09-19 VITALS — BP 109/60 | HR 68 | Wt 124.0 lb

## 2021-09-19 DIAGNOSIS — Z3042 Encounter for surveillance of injectable contraceptive: Secondary | ICD-10-CM | POA: Diagnosis not present

## 2021-09-19 MED ORDER — MEDROXYPROGESTERONE ACETATE 150 MG/ML IM SUSP
150.0000 mg | Freq: Once | INTRAMUSCULAR | Status: AC
  Administered 2021-09-19: 150 mg via INTRAMUSCULAR

## 2021-09-19 NOTE — Progress Notes (Signed)
Diksha Reckart here for Depo-Provera  Injection.  Injection administered without complication. Patient will return in 3 months for next injection. ? ?Pt is requesting Hemoglobin A1C results.Pt made aware that her hemoglobin A1C is WNL. ? ?Junie [employer] language service interpreter [phone]. ? ?10chiquita l Oretta Berkland, CMA ?09/19/2021  9:59 AM ? ? ? ? ?

## 2021-09-23 ENCOUNTER — Other Ambulatory Visit (HOSPITAL_BASED_OUTPATIENT_CLINIC_OR_DEPARTMENT_OTHER): Payer: Self-pay

## 2021-12-19 ENCOUNTER — Ambulatory Visit (INDEPENDENT_AMBULATORY_CARE_PROVIDER_SITE_OTHER): Payer: BLUE CROSS/BLUE SHIELD

## 2021-12-19 ENCOUNTER — Other Ambulatory Visit: Payer: Self-pay

## 2021-12-19 ENCOUNTER — Other Ambulatory Visit: Payer: Self-pay | Admitting: Advanced Practice Midwife

## 2021-12-19 ENCOUNTER — Other Ambulatory Visit (HOSPITAL_BASED_OUTPATIENT_CLINIC_OR_DEPARTMENT_OTHER): Payer: Self-pay

## 2021-12-19 VITALS — BP 111/75 | HR 70 | Wt 127.0 lb

## 2021-12-19 DIAGNOSIS — Z3042 Encounter for surveillance of injectable contraceptive: Secondary | ICD-10-CM

## 2021-12-19 DIAGNOSIS — Z3040 Encounter for surveillance of contraceptives, unspecified: Secondary | ICD-10-CM

## 2021-12-19 MED ORDER — MEDROXYPROGESTERONE ACETATE 150 MG/ML IM SUSY
150.0000 mg | PREFILLED_SYRINGE | INTRAMUSCULAR | 3 refills | Status: DC
  Filled 2021-12-19: qty 1, 84d supply, fill #0

## 2021-12-19 MED ORDER — MEDROXYPROGESTERONE ACETATE 150 MG/ML IM SUSP
150.0000 mg | Freq: Once | INTRAMUSCULAR | Status: AC
  Administered 2021-12-19: 150 mg via INTRAMUSCULAR

## 2021-12-19 NOTE — Progress Notes (Addendum)
Janice Hines here for Depo-Provera  Injection.  Injection administered without complication. Patient will return in 3 months for next injection.  Janice Hines l Janice Hines, CMA 12/19/2021  9:06 AM  [employer] language interpreter Janice Hines.   Attestation of Attending Supervision of CMA/RN: Evaluation and management procedures were performed by the nurse under my supervision and collaboration.  I have reviewed the nursing note and chart, and I agree with the management and plan.  Janice Hines, M.D., Janice Hines

## 2022-03-13 ENCOUNTER — Ambulatory Visit (INDEPENDENT_AMBULATORY_CARE_PROVIDER_SITE_OTHER): Payer: BLUE CROSS/BLUE SHIELD

## 2022-03-13 ENCOUNTER — Other Ambulatory Visit (HOSPITAL_BASED_OUTPATIENT_CLINIC_OR_DEPARTMENT_OTHER): Payer: Self-pay

## 2022-03-13 DIAGNOSIS — Z3042 Encounter for surveillance of injectable contraceptive: Secondary | ICD-10-CM | POA: Diagnosis not present

## 2022-03-13 DIAGNOSIS — Z3040 Encounter for surveillance of contraceptives, unspecified: Secondary | ICD-10-CM

## 2022-03-13 MED ORDER — MEDROXYPROGESTERONE ACETATE 150 MG/ML IM SUSP: 150.0000 mg | Freq: Once | INTRAMUSCULAR | Status: DC

## 2022-03-13 MED ORDER — MEDROXYPROGESTERONE ACETATE 150 MG/ML IM SUSY
150.0000 mg | PREFILLED_SYRINGE | INTRAMUSCULAR | 3 refills | Status: DC
  Filled 2022-03-13: qty 1, 90d supply, fill #0

## 2022-03-13 MED ORDER — MEDROXYPROGESTERONE ACETATE 150 MG/ML IM SUSP
150.0000 mg | Freq: Once | INTRAMUSCULAR | Status: AC
  Administered 2022-03-13: 150 mg via INTRAMUSCULAR

## 2022-03-13 NOTE — Progress Notes (Cosign Needed)
Date last pap: 06-14-2020. Last Depo-Provera: 12-22-2021. Side Effects if any: none. Serum HCG indicated? NA. Depo-Provera 150 mg IM given by: Sharlene Dory RN. Next appointment due 3 months.    [facility]

## 2022-05-29 ENCOUNTER — Ambulatory Visit (INDEPENDENT_AMBULATORY_CARE_PROVIDER_SITE_OTHER): Payer: BLUE CROSS/BLUE SHIELD

## 2022-05-29 ENCOUNTER — Other Ambulatory Visit (HOSPITAL_BASED_OUTPATIENT_CLINIC_OR_DEPARTMENT_OTHER): Payer: Self-pay

## 2022-05-29 VITALS — BP 117/74 | HR 68 | Wt 130.0 lb

## 2022-05-29 DIAGNOSIS — Z3042 Encounter for surveillance of injectable contraceptive: Secondary | ICD-10-CM | POA: Diagnosis not present

## 2022-05-29 DIAGNOSIS — Z30013 Encounter for initial prescription of injectable contraceptive: Secondary | ICD-10-CM

## 2022-05-29 MED ORDER — MEDROXYPROGESTERONE ACETATE 150 MG/ML IM SUSP
150.0000 mg | Freq: Once | INTRAMUSCULAR | Status: AC
  Administered 2022-05-29: 150 mg via INTRAMUSCULAR

## 2022-05-29 MED ORDER — MEDROXYPROGESTERONE ACETATE 150 MG/ML IM SUSY
150.0000 mg | PREFILLED_SYRINGE | INTRAMUSCULAR | 3 refills | Status: AC
  Filled 2022-05-29: qty 1, 90d supply, fill #0
  Filled 2022-08-21: qty 1, 90d supply, fill #1

## 2022-05-29 NOTE — Progress Notes (Signed)
Janice Hines here for Depo-Provera  Injection.  Injection administered without complication. Patient will return in 3 months for next injection.  Alfons Sulkowski l Charmika Macdonnell, CMA 05/29/2022  10:10 AM  [employer] language services interpreter Mimi [phone].

## 2022-08-21 ENCOUNTER — Ambulatory Visit (INDEPENDENT_AMBULATORY_CARE_PROVIDER_SITE_OTHER): Payer: BLUE CROSS/BLUE SHIELD

## 2022-08-21 ENCOUNTER — Other Ambulatory Visit (HOSPITAL_BASED_OUTPATIENT_CLINIC_OR_DEPARTMENT_OTHER): Payer: Self-pay

## 2022-08-21 VITALS — BP 121/82 | HR 78 | Wt 129.0 lb

## 2022-08-21 DIAGNOSIS — Z3042 Encounter for surveillance of injectable contraceptive: Secondary | ICD-10-CM

## 2022-08-21 MED ORDER — MEDROXYPROGESTERONE ACETATE 150 MG/ML IM SUSY
150.0000 mg | PREFILLED_SYRINGE | Freq: Once | INTRAMUSCULAR | Status: AC
  Administered 2022-08-21: 150 mg via INTRAMUSCULAR

## 2022-08-21 NOTE — Progress Notes (Signed)
Janice Hines here for Depo-Provera  Injection.  Injection administered without complication. Patient will return in 3 months for next injection.  Yacoub Diltz l Eual Lindstrom, CMA 08/21/2022  9:50 AM    [employer] language services interpreter Loura Pardon [phone]

## 2022-09-12 ENCOUNTER — Telehealth: Payer: Self-pay | Admitting: General Practice

## 2022-09-12 NOTE — Telephone Encounter (Signed)
Left message on VM (interpreter used) for patient to contact our office to schedule [place] (MAY 14 - MAY 28).

## 2022-09-12 NOTE — Telephone Encounter (Signed)
-----   Message from Maurine Minister, [place] sent at 08/21/2022  9:55 AM EST ----- Regarding: [place] (MAY 14 - MAY 28)
